# Patient Record
Sex: Female | Born: 1985
Health system: Southern US, Community
[De-identification: ages and names within clinical notes are randomized; demographics above are authoritative.]

## PROBLEM LIST (undated history)

## (undated) DIAGNOSIS — F419 Anxiety disorder, unspecified: Secondary | ICD-10-CM

## (undated) DIAGNOSIS — R55 Syncope and collapse: Secondary | ICD-10-CM

## (undated) DIAGNOSIS — T7840XA Allergy, unspecified, initial encounter: Secondary | ICD-10-CM

## (undated) HISTORY — DX: Anxiety disorder, unspecified: F41.9

## (undated) HISTORY — DX: Allergy, unspecified, initial encounter: T78.40XA

## (undated) HISTORY — DX: Syncope and collapse: R55

---

## 2009-06-04 ENCOUNTER — Emergency Department (HOSPITAL_COMMUNITY): Admission: EM | Admit: 2009-06-04 | Discharge: 2009-06-04 | Payer: Self-pay | Admitting: Family Medicine

## 2011-04-23 ENCOUNTER — Emergency Department (HOSPITAL_COMMUNITY): Payer: BC Managed Care – PPO

## 2011-04-23 ENCOUNTER — Emergency Department (HOSPITAL_COMMUNITY)
Admission: EM | Admit: 2011-04-23 | Discharge: 2011-04-23 | Disposition: A | Discharge status: Home / Self Care | Payer: BC Managed Care – PPO | Attending: Emergency Medicine | Admitting: Emergency Medicine

## 2011-04-23 ENCOUNTER — Other Ambulatory Visit: Payer: Self-pay

## 2011-04-23 ENCOUNTER — Encounter (HOSPITAL_COMMUNITY): Payer: Self-pay | Admitting: Emergency Medicine

## 2011-04-23 DIAGNOSIS — S40011A Contusion of right shoulder, initial encounter: Secondary | ICD-10-CM

## 2011-04-23 DIAGNOSIS — Y9269 Other specified industrial and construction area as the place of occurrence of the external cause: Secondary | ICD-10-CM | POA: Insufficient documentation

## 2011-04-23 DIAGNOSIS — R55 Syncope and collapse: Secondary | ICD-10-CM | POA: Insufficient documentation

## 2011-04-23 DIAGNOSIS — S40019A Contusion of unspecified shoulder, initial encounter: Secondary | ICD-10-CM | POA: Insufficient documentation

## 2011-04-23 DIAGNOSIS — J40 Bronchitis, not specified as acute or chronic: Secondary | ICD-10-CM

## 2011-04-23 DIAGNOSIS — R296 Repeated falls: Secondary | ICD-10-CM | POA: Insufficient documentation

## 2011-04-23 HISTORY — DX: Syncope and collapse: R55

## 2011-04-23 LAB — POCT I-STAT, CHEM 8
Chloride: 105 mEq/L (ref 96–112)
Creatinine, Ser: 0.8 mg/dL (ref 0.50–1.10)
Glucose, Bld: 86 mg/dL (ref 70–99)
Potassium: 4.1 mEq/L (ref 3.5–5.1)

## 2011-04-23 MED ORDER — AZITHROMYCIN 250 MG PO TABS: ORAL_TABLET | ORAL | Status: AC

## 2011-04-23 MED ORDER — IBUPROFEN 200 MG PO TABS
600.0000 mg | ORAL_TABLET | Freq: Four times a day (QID) | ORAL | Status: DC | PRN
  Administered 2011-04-23: 600 mg via ORAL
  Filled 2011-04-23: qty 3

## 2011-04-23 NOTE — ED Notes (Signed)
Urine collected and sent down to lab for keeping. No order for testing at this time. RN notified 

## 2011-04-23 NOTE — ED Provider Notes (Signed)
History     CSN: [id number]  Arrival date & time 04/23/11  1225   First MD Initiated Contact with Patient 04/23/11 1248      Chief Complaint  Patient presents with  . 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 She states she has clear rhinorrhea and a dry cough. She denies fever, nausea, or diarrhea. 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 She states she has pain in her right shoulder since she had her syncopal episode. She states she's had syncopal episodes before as a child. She states her mother also has syncopal episodes. Patient presents via EMS with full C-spine precautions.  PCP none  Past Medical History  Diagnosis Date  . Syncope     History reviewed. No pertinent past surgical history.  No family history on file. Mother of patient syncope  History  Substance Use Topics  . Smoking status: No  . Smokeless tobacco: Not on file  . Alcohol Use: 2.4 oz/week    2 Glasses of wine, 2 Cans of beer per week   employed Just graduated  from college OB History    Grav Para Term Preterm Abortions TAB SAB Ect Mult Living                  Review of Systems  All other systems reviewed and are negative.    Allergies  Amoxicillin  Home Medications   Current Outpatient Rx  Name Route Sig Dispense Refill  . GUAIFENESIN ER 600 MG PO TB12 Oral Take 600 mg by mouth 2 (two) times daily as needed. For congestion    . NORETHIN ACE-ETH ESTRAD-FE 1-20 MG-MCG PO TABS Oral Take 1 tablet by mouth daily.    Marland Kitchen PSEUDOEPH-DOXYLAMINE-DM-APAP 60-7.08-08-998 MG/30ML PO LIQD Oral  Take 30 mLs by mouth at bedtime as needed. For cough/cold      BP 111/71  Pulse 67  Temp(Src) 98.1 F (36.7 C) (Oral)  Resp 20  SpO2 100%  LMP 04/16/2011  Vital signs normal    Physical Exam  Nursing note and vitals reviewed. Constitutional: She is oriented to person, place, and time. She appears well-developed and well-nourished.  Non-toxic appearance. She does not appear ill. No distress.       Pt immobolized on back board with C collar in place.   Pt removed from backboard during my exam and C collar left in place.   HENT:  Head: Normocephalic and atraumatic.  Right Ear: External ear normal.  Left Ear: External ear normal.  Nose: Nose normal. No mucosal edema or rhinorrhea.  Mouth/Throat: Oropharynx is clear and moist and mucous membranes are normal. No dental abscesses or uvula swelling.  Eyes: Conjunctivae and EOM are normal. Pupils are equal, round, and reactive to light.  Neck: Normal range of motion and full passive range of motion without pain. Neck supple.       Patient denies any neck pain. Her C-spine was palpated after loosing her c-collar. There is able to have normal range of motion without any pain in her c-collar was removed  Cardiovascular: Normal  rate, regular rhythm and normal heart sounds.  Exam reveals no gallop and no friction rub.   No murmur heard. Pulmonary/Chest: Effort normal and breath sounds normal. No respiratory distress. She has no wheezes. She has no rhonchi. She has no rales. She exhibits no tenderness and no crepitus.  Abdominal: Soft. Normal appearance and bowel sounds are normal. She exhibits no distension. There is no tenderness. There is no rebound and no guarding.  Musculoskeletal: Normal range of motion. She exhibits no edema and no tenderness.       Moves all extremities well. Her thoracic and lumbar spine are nontender to palpation.  Patient has some tenderness over her a.c. joint on the right. Clavicle is nontender to palpation she is  able to abduct her right shoulder with minor discomfort.  Neurological: She is alert and oriented to person, place, and time. She has normal strength. No cranial nerve deficit.  Skin: Skin is warm, dry and intact. No rash noted. No erythema. No pallor.  Psychiatric: She has a normal mood and affect. Her speech is normal and behavior is normal. Her mood appears not anxious.    ED Course  Procedures (including critical care time)   Medications  ibuprofen (ADVIL,MOTRIN) tablet 600 mg (600 mg Oral Given 04/23/11 1340)  azithromycin (ZITHROMAX Z-PAK) 250 MG tablet (not administered)  Patient given ice pack.  She relates her pain is better. She  has intact range of motion. She states she does not need something stronger for pain.   Results for orders placed during the hospital encounter of 04/23/11  POCT I-STAT, CHEM 8      Component Value Range   Sodium 139  135 - 145 (mEq/L)   Potassium 4.1  3.5 - 5.1 (mEq/L)   Chloride 105  96 - 112 (mEq/L)   BUN 7  6 - 23 (mg/dL)   Creatinine, Ser 0.98  0.50 - 1.10 (mg/dL)   Glucose, Bld 86  70 - 99 (mg/dL)   Calcium, Ion 1.19  1.12 - 1.32 (mmol/L)   TCO2 27  0 - 100 (mmol/L)   Hemoglobin 13.6  12.0 - 15.0 (g/dL)   HCT 14.7  82.9 - 56.2 (%)    Laboratory interpretation all normal  Dg Chest 2 View  04/23/2011  *RADIOLOGY REPORT*  Clinical Data: Syncope and cough.  CHEST - 2 VIEW  Comparison: None.  Findings: The cardiomediastinal silhouette is within normal limits. The lungs are free of focal consolidations and pleural effusions. No edema. Visualized osseous structures have a normal appearance.  IMPRESSION: Negative exam.  Original Report Authenticated By: Patterson Hammersmith, M.D.   Dg Shoulder Right  04/23/2011  *RADIOLOGY REPORT*  Clinical Data: Syncope.  Fall.  RIGHT SHOULDER - 2+ VIEW  Comparison: Chest x-ray 04/23/2011.  Findings: There is no evidence for acute fracture or dislocation. No soft tissue foreign body or gas identified.  Visualized  right lung apex is unremarkable.  IMPRESSION: Negative exam.  Original Report Authenticated By: Patterson Hammersmith, M.D.    Date: 04/23/2011  Rate: 66  Rhythm: normal sinus rhythm  QRS Axis: normal  Intervals: normal  ST/T Wave abnormalities: normal  Conduction Disutrbances:none  Narrative Interpretation:   Old EKG Reviewed: none available     Dg Chest 2 View  04/23/2011  *RADIOLOGY REPORT*  Clinical Data: Syncope and cough.  CHEST - 2 VIEW  Comparison: None.  Findings: The cardiomediastinal silhouette is within normal limits. The lungs are free of focal consolidations and pleural effusions. No edema.  Visualized osseous structures have a normal appearance.  IMPRESSION: Negative exam.  Original Report Authenticated By: Patterson Hammersmith, M.D.   Dg Shoulder Right  04/23/2011  *RADIOLOGY REPORT*  Clinical Data: Syncope.  Fall.  RIGHT SHOULDER - 2+ VIEW  Comparison: Chest x-ray 04/23/2011.  Findings: There is no evidence for acute fracture or dislocation. No soft tissue foreign body or gas identified.  Visualized right lung apex is unremarkable.  IMPRESSION: Negative exam.  Original Report Authenticated By: Patterson Hammersmith, M.D.     1. Syncope   2. Contusion of shoulder, right   3. Bronchitis     New Prescriptions   AZITHROMYCIN (ZITHROMAX Z-PAK) 250 MG TABLET    Take 2 po the first day then once a day for the next 4 days.    Plan discharge  Devoria Albe, MD, FACEP    MDM          Ward Givens, MD 04/23/11 1534

## 2011-04-23 NOTE — ED Notes (Signed)
UJW:[id number]<YN> Expected date:04/23/11<BR> Expected time:12:21 PM<BR> Means of arrival:Ambulance<BR> Comments:<BR> M110. 26 yo f. Syncope and fall. LSB. 5 min

## 2012-03-30 ENCOUNTER — Ambulatory Visit: Payer: BC Managed Care – PPO | Admitting: Internal Medicine

## 2012-03-30 VITALS — BP 122/22 | HR 83 | Temp 99.0°F | Resp 12 | Ht 67.0 in | Wt 142.0 lb

## 2012-03-30 DIAGNOSIS — B9789 Other viral agents as the cause of diseases classified elsewhere: Secondary | ICD-10-CM

## 2012-03-30 DIAGNOSIS — J069 Acute upper respiratory infection, unspecified: Secondary | ICD-10-CM

## 2012-03-31 NOTE — Progress Notes (Signed)
  Subjective:    Patient ID: Pamela Barnes, female    DOB: 1985-05-25, 27 y.o.   MRN: [id number]  HPI was treated for flu symptoms at another urgent care 3 days ago, and taken out of work. 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 She is now well, and her fever has resolved.  [employer] graduate in Pharmacist, community  Review of Systems     Objective:   Physical Exam Vital signs stable HEENT clear Lungs clear       Assessment & Plan:  Resolved infection Return to work

## 2012-08-28 ENCOUNTER — Encounter (HOSPITAL_COMMUNITY): Payer: Self-pay | Admitting: Emergency Medicine

## 2012-08-28 ENCOUNTER — Emergency Department (HOSPITAL_COMMUNITY)
Admission: EM | Admit: 2012-08-28 | Discharge: 2012-08-28 | Discharge status: Against Medical Advice | Payer: BC Managed Care – PPO | Source: Home / Self Care | Attending: Emergency Medicine | Admitting: Emergency Medicine

## 2012-08-28 DIAGNOSIS — W19XXXA Unspecified fall, initial encounter: Secondary | ICD-10-CM

## 2012-08-28 DIAGNOSIS — R55 Syncope and collapse: Secondary | ICD-10-CM

## 2012-08-28 DIAGNOSIS — S0990XA Unspecified injury of head, initial encounter: Secondary | ICD-10-CM

## 2012-08-28 NOTE — ED Notes (Signed)
Patient reports she was at a concert last night felt dizzy and passed out. This is a recurring condition. Boyfriend attempted to catch her but she did hit her head on the concrete floor. No other problems. Slight headache this morning from where she hit her head. Patient is alert and oriented.

## 2012-08-28 NOTE — ED Provider Notes (Signed)
Medical screening examination/treatment/procedure(s) were performed by non-physician practitioner and as supervising physician I was immediately available for consultation/collaboration.  Raynald Blend, MD 08/28/12 1226

## 2012-08-28 NOTE — ED Provider Notes (Signed)
History     CSN: [id number]  Arrival date & time 08/28/12  1031   First MD Initiated Contact with Patient 08/28/12 1140      Chief Complaint  Patient presents with  . Loss of Consciousness    (Consider location/radiation/quality/duration/timing/severity/associated sxs/prior treatment) HPI Comments: This 27 year old female is accompanied by her significant other for evaluation after she had a syncopal episode at a concert last night in which she fell to the ground and struck her head. They stated that there was a physician that evaluated her as well as EMS on site and she was feeling well and asymptomatic at the time and did not seek medical treatment. She has a history of unexplained syncope since childhood. Several years ago she was evaluated by a neurologist but does not remember if she was evaluated by cardiologist. Nonetheless, she continues to have these syncopal episodes. She is unsure as to what causes them. Her only complaint today is some mild soreness to the scalp she had her head. She denies problems with vision, speech, hearing, swallowing, focal paresthesias or weakness. She denies incontinence or imbalance. Denies headache or dizziness. Denies chest pain, palpitations, irregular heartbeat, shortness of breath, abdominal pain, nausea or vomiting.   Past Medical History  Diagnosis Date  . Syncope     History reviewed. No pertinent past surgical history.  No family history on file.  History  Substance Use Topics  . Smoking status: Never Smoker   . Smokeless tobacco: Not on file  . Alcohol Use: 2.4 oz/week    2 Glasses of wine, 2 Cans of beer per week    OB History   Grav Para Term Preterm Abortions TAB SAB Ect Mult Living                  Review of Systems  Constitutional: Negative.   HENT: Negative for ear pain, congestion, sore throat, facial swelling, rhinorrhea, trouble swallowing, neck pain, neck stiffness, postnasal drip and ear discharge.   Eyes: Negative  for photophobia, pain, discharge, redness, itching and visual disturbance.  Respiratory: Negative for cough, choking, chest tightness, shortness of breath, wheezing and stridor.   Cardiovascular: Negative for chest pain and leg swelling.  Gastrointestinal: Negative.   Genitourinary: Negative.   Musculoskeletal: Negative for myalgias, back pain, joint swelling and gait problem.  Skin: Negative.   Neurological: Negative for dizziness, tremors, seizures, syncope, facial asymmetry, speech difficulty, weakness, light-headedness, numbness and headaches.  Hematological: Does not bruise/bleed easily.  Psychiatric/Behavioral: Negative.     Allergies  Dust mite extract and Amoxicillin  Home Medications   Current Outpatient Rx  Name  Route  Sig  Dispense  Refill  . naproxen (NAPROSYN) 250 MG tablet   Oral   Take 250 mg by mouth 2 (two) times daily with a meal.         . norethindrone-ethinyl estradiol (JUNEL FE,GILDESS FE,LOESTRIN FE) 1-20 MG-MCG tablet   Oral   Take 1 tablet by mouth daily.         Marland Kitchen guaiFENesin (MUCINEX) 600 MG 12 hr tablet   Oral   Take 600 mg by mouth 2 (two) times daily as needed. For congestion         . Pseudoeph-Doxylamine-DM-APAP (NYQUIL) 60-7.08-08-998 MG/30ML LIQD   Oral   Take 30 mLs by mouth at bedtime as needed. For cough/cold           BP 97/73  Pulse 70  Temp(Src) 98.3 F (36.8 C)  Resp 14  SpO2  100%  LMP 08/22/2012  Physical Exam  Nursing note and vitals reviewed. Constitutional: She is oriented to person, place, and time. She appears well-developed and well-nourished. No distress.  HENT:  Head: Normocephalic and atraumatic.  Right Ear: External ear normal.  Left Ear: External ear normal.  Nose: Nose normal.  Mouth/Throat: Oropharynx is clear and moist. No oropharyngeal exudate.  Left TM obscured with cerumen.  Eyes: Conjunctivae and EOM are normal. Pupils are equal, round, and reactive to light. Right eye exhibits no discharge.  Left eye exhibits no discharge. No scleral icterus.  Neck: Normal range of motion. Neck supple.  No spinal tenderness. Or deformity.  Cardiovascular: Normal rate, normal heart sounds and intact distal pulses.  Exam reveals no gallop and no friction rub.   No murmur heard. Pulmonary/Chest: Effort normal and breath sounds normal. No respiratory distress. She has no wheezes. She has no rales.  Abdominal: Soft. There is no tenderness. There is no rebound and no guarding.  Musculoskeletal: Normal range of motion. She exhibits no edema and no tenderness.  Lymphadenopathy:    She has no cervical adenopathy.  Neurological: She is alert and oriented to person, place, and time. She has normal strength. She displays no tremor. No sensory deficit. She exhibits normal muscle tone. She displays a negative Romberg sign. Coordination and gait normal. GCS eye subscore is 4. GCS verbal subscore is 5. GCS motor subscore is 6.  Reflex Scores:      Patellar reflexes are 1+ on the right side and 1+ on the left side. Skin: Skin is warm and dry.  Psychiatric: She has a normal mood and affect. Her behavior is normal.    ED Course  Procedures (including critical care time)  Labs Reviewed - No data to display No results found.   1. Syncope and collapse   2. Fall, initial encounter   3. Head injury, acute, initial encounter       MDM  After having her syncope episode last night, falling and striking her head she states she does feel well but she presented to the urgent care today to make sure that everything was okay and nothing new or serious was going on. After performing an H&P which was essentially negative he was advised that she be shuttled down to the emergency department for additional evaluation as we are limited in the evaluation of syncope and head injury urgent care. After considerable conference she and her significant other decided that she felt well today and did not wish to go down to the  emergency department for additional evaluation. 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 She will be given instructions on syncope and red flags. Patient to sign AMA in regards to her declining to go to the emergency department for additional evaluation. If there is a neurologic or Cardiologic problem causing these syncopal episodes it may occur at any time such as when driving, working or performing dangerous tasks. The condition may get worse. Consequences are potential injury, hand or heart injury, danger to others or other potential a severe injury.        Hayden Rasmussen, NP 08/28/12 1224

## 2014-03-17 ENCOUNTER — Ambulatory Visit (INDEPENDENT_AMBULATORY_CARE_PROVIDER_SITE_OTHER): Payer: Managed Care, Other (non HMO) | Admitting: Cardiology

## 2014-03-17 ENCOUNTER — Encounter: Payer: Self-pay | Admitting: Cardiology

## 2014-03-17 VITALS — BP 122/72 | HR 79 | Ht 67.0 in | Wt 153.1 lb

## 2014-03-17 DIAGNOSIS — R55 Syncope and collapse: Secondary | ICD-10-CM

## 2014-03-17 DIAGNOSIS — I951 Orthostatic hypotension: Secondary | ICD-10-CM

## 2014-03-17 NOTE — Patient Instructions (Signed)
Your physician recommends that you continue on your current medications as directed. Please refer to the Current Medication list given to you today.   Your physician has requested that you have an echocardiogram. Echocardiography is a painless test that uses sound waves to create images of your heart. It provides your doctor with information about the size and shape of your heart and how well your heart's chambers and valves are working. This procedure takes approximately one hour. There are no restrictions for this procedure.    Your physician recommends that you schedule a follow-up appointment in: AS NEEDED WITH DR NELSON  

## 2014-03-17 NOTE — Progress Notes (Signed)
Patient ID: Pamela Barnes, female   DOB: 09-06-1985, 29 y.o.   MRN: [id number]    Patient Name: Pamela Barnes Date of Encounter: 03/17/2014  Primary Care Provider:  Pcp Not In System Primary Cardiologist:  Lars Masson   Problem List   Past Medical History  Diagnosis Date  . Syncope    History reviewed. No pertinent past surgical history.  Allergies  Allergies  Allergen Reactions  . Dust Mite Extract   . Amoxicillin Rash and Hives    HPI  Pleasant 29 year old female with no significant past medical history other than recurrent syncope that started during her childhood. She will usually pass out but once he year and those episodes in childhood were associated with prolonged standing but sometimes she could be sitting or just playing. She has had 3 episodes in the last year on one of them she was on sitting for prolonged time at the office at a computer she stood up went to bathroom and felt very weak nauseous set of bathroom floor and woke up later. The other occasion was last summer on her whole [place] she was at the concert with her fianc and passed out after 20 minutes of standing. She has never passed out while driving. She doesn't feel any palpitations prior to these episodes or any episodes of chest pain she has had an episode in the past where she was straining on the toilet. She denies any chest pain. She feels some palpitation after the episodes associated with shortness of breath. No involuntary bowel or urine incontinence associated with the episode no seizure-like activity witnessed during the episodes.  Home Medications  Prior to Admission medications   Medication Sig Start Date End Date Taking? Authorizing Provider  desogestrel-ethinyl estradiol (APRI) 0.15-30 MG-MCG tablet Take 1 tablet by mouth daily.  03/02/14  Yes Historical Provider, MD  guaiFENesin (MUCINEX) 600 MG 12 hr tablet Take 600 mg by mouth 2 (two) times daily as needed. For congestion   Yes Historical  Provider, MD  ibuprofen (ADVIL,MOTRIN) 800 MG tablet Take 800 mg by mouth daily as needed.   Yes Historical Provider, MD  naproxen (NAPROSYN) 250 MG tablet Take 250 mg by mouth 2 (two) times daily with a meal.   Yes Historical Provider, MD    Family History  History reviewed. No pertinent family history.  Social History  History   Social History  . Marital Status: Single    Spouse Name: N/A    Number of Children: N/A  . Years of Education: N/A   Occupational History  . Not on file.   Social History Main Topics  . Smoking status: Never Smoker   . Smokeless tobacco: Not on file  . Alcohol Use: 2.4 oz/week    2 Glasses of wine, 2 Cans of beer per week  . Drug Use: No  . Sexual Activity: Yes    Birth Control/ Protection: Condom   Other Topics Concern  . Not on file   Social History Narrative     Review of Systems, as per HPI, otherwise negative General:  No chills, fever, night sweats or weight changes.  Cardiovascular:  No chest pain, dyspnea on exertion, edema, orthopnea, palpitations, paroxysmal nocturnal dyspnea. Dermatological: No rash, lesions/masses Respiratory: No cough, dyspnea Urologic: No hematuria, dysuria Abdominal:   No nausea, vomiting, diarrhea, bright red blood per rectum, melena, or hematemesis Neurologic:  No visual changes, wkns, changes in mental status. All other systems reviewed and are otherwise negative except as noted above.  Physical Exam  Blood pressure 122/72, pulse 79, height 5\' 7"  (1.702 m), weight 153 lb 1.9 oz (69.455 kg).  General: Pleasant, NAD Psych: Normal affect. Neuro: Alert and oriented X 3. Moves all extremities spontaneously. HEENT: Normal  Neck: Supple without bruits or JVD. Lungs:  Resp regular and unlabored, CTA. Heart: RRR no s3, s4, or murmurs. Abdomen: Soft, non-tender, non-distended, BS + x 4.  Extremities: No clubbing, cyanosis or edema. DP/PT/Radials 2+ and equal bilaterally.  Labs:  No results for input(s):  CKTOTAL, CKMB, TROPONINI in the last 72 hours. Lab Results  Component Value Date   HGB 13.6 04/23/2011   HCT 40.0 04/23/2011    No results found for: DDIMER Invalid input(s): POCBNP    Component Value Date/Time   NA 139 04/23/2011 1504   K 4.1 04/23/2011 1504   CL 105 04/23/2011 1504   GLUCOSE 86 04/23/2011 1504   BUN 7 04/23/2011 1504   CREATININE 0.80 04/23/2011 1504   No results found for: CHOL, HDL, LDLCALC, TRIG  Accessory Clinical Findings  Echocardiogram - none  ECG - NSR, normal ECG, unchanged from 2013.    Assessment & Plan  29 year old female   1. Recurrent syncope - orthostatic and vasovagal - negative orthostatics in the office today. She is advise to stay hydrated all the time, add electrolytes drinks like Gatorade on hot days and days she goes to the gym. No medication needed at this time. We will order echocardiogram to rule out structural heart disease.  Follow up as needed.   Lars MassonNELSON, Jadalyn Oliveri H, MD, [facility] 03/17/2014, 12:08 PM

## 2014-03-24 ENCOUNTER — Ambulatory Visit (HOSPITAL_COMMUNITY): Payer: Managed Care, Other (non HMO) | Attending: Cardiovascular Disease

## 2014-03-24 DIAGNOSIS — R55 Syncope and collapse: Secondary | ICD-10-CM | POA: Diagnosis not present

## 2014-03-24 NOTE — Progress Notes (Signed)
2D Echo completed. 03/24/2014 

## 2014-06-17 ENCOUNTER — Encounter: Payer: Self-pay | Admitting: Obstetrics and Gynecology

## 2015-11-28 ENCOUNTER — Telehealth: Payer: Self-pay | Admitting: Internal Medicine

## 2015-11-28 NOTE — Telephone Encounter (Signed)
Fine with me

## 2015-11-28 NOTE — Telephone Encounter (Signed)
Pt request to be new pt with Dr. Okey Duprerawford. Pt stating her spouse, Melvenia BeamJustin McGehee, ask about it when he was here and Dr. Halford Decamprawford okey this. Please verify.

## 2015-12-09 ENCOUNTER — Ambulatory Visit (INDEPENDENT_AMBULATORY_CARE_PROVIDER_SITE_OTHER): Payer: Managed Care, Other (non HMO) | Admitting: Internal Medicine

## 2015-12-09 ENCOUNTER — Encounter: Payer: Self-pay | Admitting: Internal Medicine

## 2015-12-09 DIAGNOSIS — F4323 Adjustment disorder with mixed anxiety and depressed mood: Secondary | ICD-10-CM | POA: Diagnosis not present

## 2015-12-09 MED ORDER — BUSPIRONE HCL 5 MG PO TABS: 5.0000 mg | ORAL_TABLET | Freq: Two times a day (BID) | ORAL | 0 refills | Status: DC | PRN

## 2015-12-09 NOTE — Progress Notes (Signed)
   Subjective:    Patient ID: Pamela Barnes, female    DOB: 1985/04/11, 30 y.o.   MRN: [id number]  HPI The patient is a 30 YO female coming in new for depression and anxiety. She has been struggling with it for some time. She wants to have help for it with a counselor. She saw a Librarian, academicchristian counselor in the past and did not like that experience. She denies long term problems with this but a lot of stress in her life in the last year with recent new house, marriage, MBA program and working full time. Denies SI/HI. Is sleeping well but tired a lot. 1 panic attack which was mild to moderate and <5 minutes. She has never taken anything for this in the past.   [facility], social history reviewed and updated.   Review of Systems  Constitutional: Positive for activity change and fatigue. Negative for appetite change, chills, fever and unexpected weight change.  HENT: Negative.   Eyes: Negative.   Respiratory: Negative for cough, chest tightness and shortness of breath.   Cardiovascular: Negative for chest pain, palpitations and leg swelling.  Gastrointestinal: Negative for abdominal distention, abdominal pain, constipation, diarrhea and nausea.  Musculoskeletal: Negative.   Skin: Negative.   Neurological: Negative.   Psychiatric/Behavioral: Positive for decreased concentration and dysphoric mood. Negative for agitation, behavioral problems, confusion, hallucinations, self-injury, sleep disturbance and suicidal ideas. The patient is nervous/anxious. The patient is not hyperactive.       Objective:   Physical Exam  Constitutional: She is oriented to person, place, and time. She appears well-developed and well-nourished.  HENT:  Head: Normocephalic and atraumatic.  Eyes: EOM are normal.  Neck: Normal range of motion.  Cardiovascular: Normal rate and regular rhythm.   Pulmonary/Chest: Effort normal and breath sounds normal. No respiratory distress. She has no wheezes. She has no rales.  Abdominal:  Soft. Bowel sounds are normal. She exhibits no distension. There is no tenderness. There is no rebound.  Musculoskeletal: She exhibits no edema.  Neurological: She is alert and oriented to person, place, and time. Coordination normal.  Skin: Skin is warm and dry.  Psychiatric: She has a normal mood and affect.   Vitals:   12/09/15 1446  BP: 116/70  Pulse: 65  Resp: 12  Temp: 98.4 F (36.9 C)  TempSrc: Oral  SpO2: 98%  Weight: 157 lb 6.4 oz (71.4 kg)  Height: 5' 7.5" (1.715 m)      Assessment & Plan:

## 2015-12-09 NOTE — Patient Instructions (Signed)
We have sent in buspar for the mood which can help to level some. It is okay to try to take it up to 2 times per day although just take it as needed.   For the counselor we recommend: [facility] which has several good counselors and their website has their specialties and bios so you can find someone you are comfortable with.  Stress and Stress Management Stress is a normal reaction to life events. It is what you feel when life demands more than you are used to or more than you can handle. Some stress can be useful. For example, the stress reaction can help you catch the last bus of the day, study for a test, or meet a deadline at work. But stress that occurs too often or for too long can cause problems. It can affect your emotional health and interfere with relationships and normal daily activities. Too much stress can weaken your immune system and increase your risk for physical illness. If you already have a medical problem, stress can make it worse. CAUSES  All sorts of life events may cause stress. An event that causes stress for one person may not be stressful for another person. Major life events commonly cause stress. These may be positive or negative. Examples include losing your job, moving into a new home, getting married, having a baby, or losing a loved one. Less obvious life events may also cause stress, especially if they occur day after day or in combination. Examples include working long hours, driving in traffic, caring for children, being in debt, or being in a difficult relationship. SIGNS AND SYMPTOMS Stress may cause emotional symptoms including, the following:  Anxiety. This is feeling worried, afraid, on edge, overwhelmed, or out of control.  Anger. This is feeling irritated or impatient.  Depression. This is feeling sad, down, helpless, or guilty.  Difficulty focusing, remembering, or making decisions. Stress may cause physical symptoms, including the  following:   Aches and pains. These may affect your head, neck, back, stomach, or other areas of your body.  Tight muscles or clenched jaw.  Low energy or trouble sleeping. Stress may cause unhealthy behaviors, including the following:   Eating to feel better (overeating) or skipping meals.  Sleeping too little, too much, or both.  Working too much or putting off tasks (procrastination).  Smoking, drinking alcohol, or using drugs to feel better. DIAGNOSIS  Stress is diagnosed through an assessment by your health care provider. Your health care provider will ask questions about your symptoms and any stressful life events.Your health care provider will also ask about your medical history and may order blood tests or other tests. Certain medical conditions and medicine can cause physical symptoms similar to stress. Mental illness can cause emotional symptoms and unhealthy behaviors similar to stress. Your health care provider may refer you to a mental health professional for further evaluation.  TREATMENT  Stress management is the recommended treatment for stress.The goals of stress management are reducing stressful life events and coping with stress in healthy ways.  Techniques for reducing stressful life events include the following:  Stress identification. Self-monitor for stress and identify what causes stress for you. These skills may help you to avoid some stressful events.  Time management. Set your priorities, keep a calendar of events, and learn to say "no." These tools can help you avoid making too many commitments. Techniques for coping with stress include the following:  Rethinking the problem. Try to think  realistically about stressful events rather than ignoring them or overreacting. Try to find the positives in a stressful situation rather than focusing on the negatives.  Exercise. Physical exercise can release both physical and emotional tension. The key is to find a  form of exercise you enjoy and do it regularly.  Relaxation techniques. These relax the body and mind. Examples include yoga, meditation, tai chi, biofeedback, deep breathing, progressive muscle relaxation, listening to music, being out in nature, journaling, and other hobbies. Again, the key is to find one or more that you enjoy and can do regularly.  Healthy lifestyle. Eat a balanced diet, get plenty of sleep, and do not smoke. Avoid using alcohol or drugs to relax.  Strong support network. Spend time with family, friends, or other people you enjoy being around.Express your feelings and talk things over with someone you trust. Counseling or talktherapy with a mental health professional may be helpful if you are having difficulty managing stress on your own. Medicine is typically not recommended for the treatment of stress.Talk to your health care provider if you think you need medicine for symptoms of stress. HOME CARE INSTRUCTIONS  Keep all follow-up visits as directed by your health care provider.  Take all medicines as directed by your health care provider. SEEK MEDICAL CARE IF:  Your symptoms get worse or you start having new symptoms.  You feel overwhelmed by your problems and can no longer manage them on your own. SEEK IMMEDIATE MEDICAL CARE IF:  You feel like hurting yourself or someone else.   This information is not intended to replace advice given to you by your health care provider. Make sure you discuss any questions you have with your health care provider.   Document Released: 08/22/2000 Document Revised: 03/19/2014 Document Reviewed: 10/21/2012 Elsevier Interactive Patient Education [employer].

## 2015-12-09 NOTE — Progress Notes (Signed)
Pre visit review using our clinic review tool, if applicable. No additional management support is needed unless otherwise documented below in the visit note. 

## 2015-12-09 NOTE — Assessment & Plan Note (Signed)
Rx for buspar and given recommendations for counseling. She is exercising and given stress relief strategies. She will work on prioritizing her activities to preserve her energy.

## 2016-06-27 ENCOUNTER — Telehealth: Payer: Self-pay | Admitting: Internal Medicine

## 2016-06-27 NOTE — Telephone Encounter (Signed)
Patient requesting refill on birth control to be sent to [facility] on [address].

## 2016-06-27 NOTE — Telephone Encounter (Signed)
PCP hasnt prescribed this medication for patient, please advise

## 2016-06-28 MED ORDER — DESOGESTREL-ETHINYL ESTRADIOL 0.15-30 MG-MCG PO TABS: 1.0000 | ORAL_TABLET | Freq: Every day | ORAL | 0 refills | Status: DC

## 2016-07-06 ENCOUNTER — Ambulatory Visit (INDEPENDENT_AMBULATORY_CARE_PROVIDER_SITE_OTHER): Payer: Managed Care, Other (non HMO) | Admitting: Internal Medicine

## 2016-07-06 ENCOUNTER — Encounter: Payer: Self-pay | Admitting: Internal Medicine

## 2016-07-06 DIAGNOSIS — Z0000 Encounter for general adult medical examination without abnormal findings: Secondary | ICD-10-CM

## 2016-07-06 DIAGNOSIS — F4323 Adjustment disorder with mixed anxiety and depressed mood: Secondary | ICD-10-CM

## 2016-07-06 NOTE — Patient Instructions (Signed)
It is worth getting in with a gynecologist. Two good groups in town are [employer] and [facility] so you can get on the website and get in with any of them.   Health Maintenance, Female Adopting a healthy lifestyle and getting preventive care can go a long way to promote health and wellness. Talk with your health care provider about what schedule of regular examinations is right for you. This is a good chance for you to check in with your provider about disease prevention and staying healthy. In between checkups, there are plenty of things you can do on your own. Experts have done a lot of research about which lifestyle changes and preventive measures are most likely to keep you healthy. Ask your health care provider for more information. Weight and diet Eat a healthy diet  Be sure to include plenty of vegetables, fruits, low-fat dairy products, and lean protein.  Do not eat a lot of foods high in solid fats, added sugars, or salt.  Get regular exercise. This is one of the most important things you can do for your health.  Most adults should exercise for at least 150 minutes each week. The exercise should increase your heart rate and make you sweat (moderate-intensity exercise).  Most adults should also do strengthening exercises at least twice a week. This is in addition to the moderate-intensity exercise. Maintain a healthy weight  Body mass index (BMI) is a measurement that can be used to identify possible weight problems. It estimates body fat based on height and weight. Your health care provider can help determine your BMI and help you achieve or maintain a healthy weight.  For females 79 years of age and older:  A BMI below 18.5 is considered underweight.  A BMI of 18.5 to 24.9 is normal.  A BMI of 25 to 29.9 is considered overweight.  A BMI of 30 and above is considered obese. Watch levels of cholesterol and blood lipids  You should start having your blood tested  for lipids and cholesterol at 29 years of age, then have this test every 5 years.  You may need to have your cholesterol levels checked more often if:  Your lipid or cholesterol levels are high.  You are older than 29 years of age.  You are at high risk for heart disease. Cancer screening Lung Cancer  Lung cancer screening is recommended for adults 34-47 years old who are at high risk for lung cancer because of a history of smoking.  A yearly low-dose CT scan of the lungs is recommended for people who:  Currently smoke.  Have quit within the past 15 years.  Have at least a 30-pack-year history of smoking. A pack year is smoking an average of one pack of cigarettes a day for 1 year.  Yearly screening should continue until it has been 15 years since you quit.  Yearly screening should stop if you develop a health problem that would prevent you from having lung cancer treatment. Breast Cancer  Practice breast self-awareness. This means understanding how your breasts normally appear and feel.  It also means doing regular breast self-exams. Let your health care provider know about any changes, no matter how small.  If you are in your 20s or 30s, you should have a clinical breast exam (CBE) by a health care provider every 1-3 years as part of a regular health exam.  If you are 18 or older, have a CBE every year. Also consider having  a breast X-ray (mammogram) every year.  If you have a family history of breast cancer, talk to your health care provider about genetic screening.  If you are at high risk for breast cancer, talk to your health care provider about having an MRI and a mammogram every year.  Breast cancer gene (BRCA) assessment is recommended for women who have family members with BRCA-related cancers. BRCA-related cancers include:  Breast.  Ovarian.  Tubal.  Peritoneal cancers.  Results of the assessment will determine the need for genetic counseling and BRCA1 and  BRCA2 testing. Cervical Cancer  Your health care provider may recommend that you be screened regularly for cancer of the pelvic organs (ovaries, uterus, and vagina). This screening involves a pelvic examination, including checking for microscopic changes to the surface of your cervix (Pap test). You may be encouraged to have this screening done every 3 years, beginning at age 59.  For women ages 50-65, health care providers may recommend pelvic exams and Pap testing every 3 years, or they may recommend the Pap and pelvic exam, combined with testing for human papilloma virus (HPV), every 5 years. Some types of HPV increase your risk of cervical cancer. Testing for HPV may also be done on women of any age with unclear Pap test results.  Other health care providers may not recommend any screening for nonpregnant women who are considered low risk for pelvic cancer and who do not have symptoms. Ask your health care provider if a screening pelvic exam is right for you.  If you have had past treatment for cervical cancer or a condition that could lead to cancer, you need Pap tests and screening for cancer for at least 20 years after your treatment. If Pap tests have been discontinued, your risk factors (such as having a new sexual partner) need to be reassessed to determine if screening should resume. Some women have medical problems that increase the chance of getting cervical cancer. In these cases, your health care provider may recommend more frequent screening and Pap tests. Colorectal Cancer  This type of cancer can be detected and often prevented.  Routine colorectal cancer screening usually begins at 29 years of age and continues through 29 years of age.  Your health care provider may recommend screening at an earlier age if you have risk factors for colon cancer.  Your health care provider may also recommend using home test kits to check for hidden blood in the stool.  A small camera at the end  of a tube can be used to examine your colon directly (sigmoidoscopy or colonoscopy). This is done to check for the earliest forms of colorectal cancer.  Routine screening usually begins at age 81.  Direct examination of the colon should be repeated every 5-10 years through 29 years of age. However, you may need to be screened more often if early forms of precancerous polyps or small growths are found. Skin Cancer  Check your skin from head to toe regularly.  Tell your health care provider about any new moles or changes in moles, especially if there is a change in a mole's shape or color.  Also tell your health care provider if you have a mole that is larger than the size of a pencil eraser.  Always use sunscreen. Apply sunscreen liberally and repeatedly throughout the day.  Protect yourself by wearing long sleeves, pants, a wide-brimmed hat, and sunglasses whenever you are outside. Heart disease, diabetes, and high blood pressure  High blood pressure  causes heart disease and increases the risk of stroke. High blood pressure is more likely to develop in:  People who have blood pressure in the high end of the normal range (130-139/85-89 mm Hg).  People who are overweight or obese.  People who are African American.  If you are 9-15 years of age, have your blood pressure checked every 3-5 years. If you are 69 years of age or older, have your blood pressure checked every year. You should have your blood pressure measured twice-once when you are at a hospital or clinic, and once when you are not at a hospital or clinic. Record the average of the two measurements. To check your blood pressure when you are not at a hospital or clinic, you can use:  An automated blood pressure machine at a pharmacy.  A home blood pressure monitor.  If you are between 40 years and 13 years old, ask your health care provider if you should take aspirin to prevent strokes.  Have regular diabetes screenings.  This involves taking a blood sample to check your fasting blood sugar level.  If you are at a normal weight and have a low risk for diabetes, have this test once every three years after 29 years of age.  If you are overweight and have a high risk for diabetes, consider being tested at a younger age or more often. Preventing infection Hepatitis B  If you have a higher risk for hepatitis B, you should be screened for this virus. You are considered at high risk for hepatitis B if:  You were born in a country where hepatitis B is common. Ask your health care provider which countries are considered high risk.  Your parents were born in a high-risk country, and you have not been immunized against hepatitis B (hepatitis B vaccine).  You have HIV or AIDS.  You use needles to inject street drugs.  You live with someone who has hepatitis B.  You have had sex with someone who has hepatitis B.  You get hemodialysis treatment.  You take certain medicines for conditions, including cancer, organ transplantation, and autoimmune conditions. Hepatitis C  Blood testing is recommended for:  Everyone born from [birth year] through 1963.  Anyone with known risk factors for hepatitis C. Sexually transmitted infections (STIs)  You should be screened for sexually transmitted infections (STIs) including gonorrhea and chlamydia if:  You are sexually active and are younger than 29 years of age.  You are older than 29 years of age and your health care provider tells you that you are at risk for this type of infection.  Your sexual activity has changed since you were last screened and you are at an increased risk for chlamydia or gonorrhea. Ask your health care provider if you are at risk.  If you do not have HIV, but are at risk, it may be recommended that you take a prescription medicine daily to prevent HIV infection. This is called pre-exposure prophylaxis (PrEP). You are considered at risk if:  You are  sexually active and do not regularly use condoms or know the HIV status of your partner(s).  You take drugs by injection.  You are sexually active with a partner who has HIV. Talk with your health care provider about whether you are at high risk of being infected with HIV. If you choose to begin PrEP, you should first be tested for HIV. You should then be tested every 3 months for as long as you are  taking PrEP. Pregnancy  If you are premenopausal and you may become pregnant, ask your health care provider about preconception counseling.  If you may become pregnant, take 400 to 800 micrograms (mcg) of folic acid every day.  If you want to prevent pregnancy, talk to your health care provider about birth control (contraception). Osteoporosis and menopause  Osteoporosis is a disease in which the bones lose minerals and strength with aging. This can result in serious bone fractures. Your risk for osteoporosis can be identified using a bone density scan.  If you are 38 years of age or older, or if you are at risk for osteoporosis and fractures, ask your health care provider if you should be screened.  Ask your health care provider whether you should take a calcium or vitamin D supplement to lower your risk for osteoporosis.  Menopause may have certain physical symptoms and risks.  Hormone replacement therapy may reduce some of these symptoms and risks. Talk to your health care provider about whether hormone replacement therapy is right for you. Follow these instructions at home:  Schedule regular health, dental, and eye exams.  Stay current with your immunizations.  Do not use any tobacco products including cigarettes, chewing tobacco, or electronic cigarettes.  If you are pregnant, do not drink alcohol.  If you are breastfeeding, limit how much and how often you drink alcohol.  Limit alcohol intake to no more than 1 drink per day for nonpregnant women. One drink equals 12 ounces of  beer, 5 ounces of wine, or 1 ounces of hard liquor.  Do not use street drugs.  Do not share needles.  Ask your health care provider for help if you need support or information about quitting drugs.  Tell your health care provider if you often feel depressed.  Tell your health care provider if you have ever been abused or do not feel safe at home. This information is not intended to replace advice given to you by your health care provider. Make sure you discuss any questions you have with your health care provider. Document Released: 09/11/2010 Document Revised: 08/04/2015 Document Reviewed: 11/30/2014 Elsevier Interactive Patient Education  2017 [employer].

## 2016-07-06 NOTE — Progress Notes (Signed)
   Subjective:    Patient ID: Pamela Barnes, female    DOB: August 13, 1985, 31 y.o.   MRN: [id number]  HPI The patient is a 31 YO female coming in for wellness. No concerns.   PMH, [facility], social history reviewed and updated.   Review of Systems  Constitutional: Negative.   HENT: Negative.   Eyes: Negative.   Respiratory: Negative for cough, chest tightness and shortness of breath.   Cardiovascular: Negative for chest pain, palpitations and leg swelling.  Gastrointestinal: Negative for abdominal distention, abdominal pain, constipation, diarrhea, nausea and vomiting.  Musculoskeletal: Negative.   Skin: Negative.   Neurological: Negative.   Psychiatric/Behavioral: Negative.       Objective:   Physical Exam  Constitutional: She is oriented to person, place, and time. She appears well-developed and well-nourished.  HENT:  Head: Normocephalic and atraumatic.  Eyes: EOM are normal.  Neck: Normal range of motion.  Cardiovascular: Normal rate and regular rhythm.   Pulmonary/Chest: Effort normal and breath sounds normal. No respiratory distress. She has no wheezes. She has no rales.  Abdominal: Soft. Bowel sounds are normal. She exhibits no distension. There is no tenderness. There is no rebound.  Musculoskeletal: She exhibits no edema.  Neurological: She is alert and oriented to person, place, and time. Coordination normal.  Skin: Skin is warm and dry.  Psychiatric: She has a normal mood and affect.   Vitals:   07/06/16 0901  BP: 130/70  Pulse: 71  Resp: 12  Temp: 98.9 F (37.2 C)  TempSrc: Oral  SpO2: 100%  Weight: 159 lb (72.1 kg)  Height: 5' 7.5" (1.715 m)      Assessment & Plan:

## 2016-07-06 NOTE — Progress Notes (Signed)
Pre visit review using our clinic review tool, if applicable. No additional management support is needed unless otherwise documented below in the visit note. 

## 2016-07-06 NOTE — Assessment & Plan Note (Signed)
Doing well with rare buspar and counseling. No worsening of symptoms.

## 2016-07-06 NOTE — Assessment & Plan Note (Signed)
Recent labs from 2016 normal so will not repeat. Tetanus and flu up to date. Exercising and non-smoker. Prefers to establish with gyn for pap smear. Counseled on sun safety and mole surveillance as well as dangers of distracted driving. Given screening recommendations.

## 2016-08-17 ENCOUNTER — Encounter: Payer: Self-pay | Admitting: Internal Medicine

## 2016-08-20 ENCOUNTER — Other Ambulatory Visit: Payer: Self-pay | Admitting: Internal Medicine

## 2016-08-20 MED ORDER — DESOGESTREL-ETHINYL ESTRADIOL 0.15-30 MG-MCG PO TABS: 1.0000 | ORAL_TABLET | Freq: Every day | ORAL | 0 refills | Status: AC

## 2016-08-27 MED ORDER — ESCITALOPRAM OXALATE 10 MG PO TABS: 10.0000 mg | ORAL_TABLET | Freq: Every day | ORAL | 1 refills | Status: DC

## 2016-08-27 NOTE — Addendum Note (Signed)
Addended by: Jeanine LuzALONE, GREGORY D on: 08/27/2016 05:42 PM   Modules accepted: Orders

## 2016-10-20 ENCOUNTER — Other Ambulatory Visit: Payer: Self-pay | Admitting: Family

## 2016-10-29 ENCOUNTER — Encounter: Payer: Self-pay | Admitting: Internal Medicine

## 2016-10-29 MED ORDER — ESCITALOPRAM OXALATE 10 MG PO TABS: 10.0000 mg | ORAL_TABLET | Freq: Every day | ORAL | 1 refills | Status: DC

## 2017-02-28 ENCOUNTER — Encounter: Payer: Self-pay | Admitting: Internal Medicine

## 2017-05-24 ENCOUNTER — Other Ambulatory Visit: Payer: Self-pay | Admitting: Internal Medicine

## 2017-05-28 ENCOUNTER — Encounter: Payer: Self-pay | Admitting: Internal Medicine

## 2017-12-04 DIAGNOSIS — Z01419 Encounter for gynecological examination (general) (routine) without abnormal findings: Secondary | ICD-10-CM | POA: Diagnosis not present

## 2017-12-04 DIAGNOSIS — Z6826 Body mass index (BMI) 26.0-26.9, adult: Secondary | ICD-10-CM | POA: Diagnosis not present

## 2017-12-04 LAB — HM PAP SMEAR

## 2017-12-09 DIAGNOSIS — Z23 Encounter for immunization: Secondary | ICD-10-CM | POA: Diagnosis not present

## 2018-01-31 DIAGNOSIS — J019 Acute sinusitis, unspecified: Secondary | ICD-10-CM | POA: Diagnosis not present

## 2018-02-10 ENCOUNTER — Encounter: Payer: Self-pay | Admitting: Internal Medicine

## 2018-05-19 ENCOUNTER — Encounter: Payer: Self-pay | Admitting: Internal Medicine

## 2018-05-20 MED ORDER — BUSPIRONE HCL 5 MG PO TABS: 5.0000 mg | ORAL_TABLET | Freq: Two times a day (BID) | ORAL | 0 refills | Status: DC | PRN

## 2018-06-02 NOTE — Progress Notes (Signed)
[name] [address] [place], [place] [address] Phone: [phone] Subjective:   Bruce Donath, am serving as a scribe for Dr. [name].  I'm seeing this patient by the request  of:  Myrlene Broker, MD   CC: calf pain   NOI:[id number]  Pamela Barnes is a 33 y.o. female coming in with complaint of left calf pain since December. Pain over medial tibia and into the calf. Pain increases with running, worse with inclines and if walking prolonged distances. Had had cupping and used kinesiotaping which has helped. Uses IBU and ice. Has been foam rolling. Was running 6 miles but is down to 2 to 3 miles. Pain starts 1 mile in. Patient was running in [place] [employer]. Is now running in [employer]. Is now running in [employer] following an assessment from [employer].       Past Medical History:  Diagnosis Date  . Allergy   . Anxiety   . Syncope   . Vaso vagal episode    No past surgical history on file. Social History   Socioeconomic History  . Marital status: Single    Spouse name: Not on file  . Number of children: Not on file  . Years of education: Not on file  . Highest education level: Not on file  Occupational History  . Not on file  Social Needs  . Financial resource strain: Not on file  . Food insecurity:    Worry: Not on file    Inability: Not on file  . Transportation needs:    Medical: Not on file    Non-medical: Not on file  Tobacco Use  . Smoking status: Never Smoker  . Smokeless tobacco: Never Used  Substance and Sexual Activity  . Alcohol use: Yes    Alcohol/week: 4.0 standard drinks    Types: 2 Glasses of wine, 2 Cans of beer per week  . Drug use: No  . Sexual activity: Yes    Birth control/protection: Condom  Lifestyle  . Physical activity:    Days per week: Not on file    Minutes per session: Not on file  . Stress: Not on file  Relationships  . Social connections:    Talks on phone: Not on file     Gets together: Not on file    Attends religious service: Not on file    Active member of club or organization: Not on file    Attends meetings of clubs or organizations: Not on file    Relationship status: Not on file  Other Topics Concern  . Not on file  Social History Narrative  . Not on file   Allergies  Allergen Reactions  . Dust Mite Extract   . Amoxicillin Rash and Hives   Family History  Problem Relation Age of Onset  . Hyperlipidemia Mother   . Hypertension Mother   . Heart disease Father   . Cancer Maternal Grandfather        skin  . Heart disease Maternal Grandfather   . Heart disease Paternal Grandmother   . Cancer Paternal Grandfather        skin    Current Outpatient Medications (Endocrine & Metabolic):  .  desogestrel-ethinyl estradiol (APRI) 0.15-30 MG-MCG tablet, Take 1 tablet by mouth daily.   Current Outpatient Medications (Respiratory):  .  guaiFENesin (MUCINEX) 600 MG 12 hr tablet, Take 600 mg by mouth 2 (two) times daily as needed. For congestion  Current  Outpatient Medications (Analgesics):  .  ibuprofen (ADVIL,MOTRIN) 800 MG tablet, Take 800 mg by mouth daily as needed. .  naproxen (NAPROSYN) 250 MG tablet, Take 250 mg by mouth 2 (two) times daily with a meal.   Current Outpatient Medications (Other):  .  busPIRone (BUSPAR) 5 MG tablet, Take 1 tablet (5 mg total) by mouth 2 (two) times daily as needed (anxiety). Marland Kitchen  escitalopram (LEXAPRO) 10 MG tablet, Take 1 tablet (10 mg total) by mouth daily. Need annual appointment for further refills .  Vitamin D, Ergocalciferol, (DRISDOL) 1.25 MG (50000 UT) CAPS capsule, Take 1 capsule (50,000 Units total) by mouth every 7 (seven) days.    Past medical history, social, surgical and family history all reviewed in electronic medical record.  No pertanent information unless stated regarding to the chief complaint.   Review of Systems:  No headache, visual changes, nausea, vomiting, diarrhea, constipation,  dizziness, abdominal pain, skin rash, fevers, chills, night sweats, weight loss, swollen lymph nodes, body aches, joint swelling, muscle aches, chest pain, shortness of breath, mood changes.   Objective  Blood pressure 110/82, pulse 72, height 5' 7.5" (1.715 m), weight 168 lb (76.2 kg), SpO2 99 %.    General: No apparent distress alert and oriented x3 mood and affect normal, dressed appropriately.  HEENT: Pupils equal, extraocular movements intact  Respiratory: Patient's speak in full sentences and does not appear short of breath  Cardiovascular: No lower extremity edema, non tender, no erythema  Skin: Warm dry intact with no signs of infection or rash on extremities or on axial skeleton.  Abdomen: Soft nontender  Neuro: Cranial nerves II through XII are intact, neurovascularly intact in all extremities with 2+ DTRs and 2+ pulses.  Lymph: No lymphadenopathy of posterior or anterior cervical chain or axillae bilaterally.  Gait normal with good balance and coordination.  MSK:  Non tender with full range of motion and good stability and symmetric strength and tone of shoulders, elbows, wrist, hip, knee and bilaterally.  Ankle: left ankle No visible erythema or swelling. Range of motion is full in all directions. Strength is 5/5 in all directions. Stable lateral and medial ligaments; squeeze test and kleiger test unremarkable; Talar dome nontender; Mild pain with pronation Able to walk 4 steps. Contralateral ankle also shows mild pronation  MSK US performed of: Left ankle This study was ordered, performed, and interpreted by Terrilee Files D.O.  Foot/Ankle:   Is doing tibialis tendon.  Not sure patient is no significant cortical defect or signs of any type of stress reaction but patient does have some mild narrowing of the cortex.  IMPRESSION: Posterior tibialis tendinitis  97110; 15 additional minutes spent for Therapeutic exercises as stated in above notes.  This included exercises  focusing on stretching, strengthening, with significant focus on eccentric aspects.   Long term goals include an improvement in range of motion, strength, endurance as well as avoiding reinjury. Patient's frequency would include in 1-2 times a day, 3-5 times a week for a duration of 6-12 weeks. Ankle strengthening that included:  Basic range of motion exercises to allow proper full motion at ankle Stretching of the lower leg and hamstrings  Theraband exercises for the lower leg - inversion, eversion, dorsiflexion and plantarflexion each to be completed with a theraband Balance exercises to increase proprioception Weight bearing exercises to increase strength and balance  Proper technique shown and discussed handout in great detail with ATC.  All questions were discussed and answered.     Impression  and Recommendations:     This case required medical decision making of moderate complexity. The above documentation has been reviewed and is accurate and complete Lyndal Pulley, DO       Note: This dictation was prepared with Dragon dictation along with smaller phrase technology. Any transcriptional errors that result from this process are unintentional.

## 2018-06-03 ENCOUNTER — Ambulatory Visit: Payer: Self-pay

## 2018-06-03 ENCOUNTER — Other Ambulatory Visit: Payer: Self-pay

## 2018-06-03 ENCOUNTER — Ambulatory Visit: Payer: BLUE CROSS/BLUE SHIELD | Admitting: Family Medicine

## 2018-06-03 ENCOUNTER — Encounter: Payer: Self-pay | Admitting: Family Medicine

## 2018-06-03 VITALS — BP 110/82 | HR 72 | Ht 67.5 in | Wt 168.0 lb

## 2018-06-03 DIAGNOSIS — M79662 Pain in left lower leg: Secondary | ICD-10-CM

## 2018-06-03 DIAGNOSIS — S86112A Strain of other muscle(s) and tendon(s) of posterior muscle group at lower leg level, left leg, initial encounter: Secondary | ICD-10-CM

## 2018-06-03 MED ORDER — DICLOFENAC SODIUM 2 % TD SOLN: 2.0000 g | Freq: Two times a day (BID) | TRANSDERMAL | 3 refills | Status: AC

## 2018-06-03 MED ORDER — VITAMIN D (ERGOCALCIFEROL) 1.25 MG (50000 UNIT) PO CAPS: 50000.0000 [IU] | ORAL_CAPSULE | ORAL | 0 refills | Status: DC

## 2018-06-03 NOTE — Assessment & Plan Note (Signed)
Posterior tibialis tendinitis.  Discussed icing regimen and home exercise, discussed which activities to do which wants to avoid.  Increase activity slowly over the course the next several weeks.  Discussed icing regimen.  Topical anti-inflammatories prescribed.  Follow-up with me again in 4 to 8 weeks

## 2018-06-03 NOTE — Patient Instructions (Signed)
Good to meet you  Good luck with your dog.  Ice 20 minutes 2 times daily. Usually after activity and before bed. Exercises 3 times a week.  Heel lift 1/16 to 1/8 inch in your regular shoes maybe not running Try running in compression socks and even consider daily short term  pennsaid pinkie amount topically 2 times daily as needed.  Once weekly vitamin D for 2 months to help muscle strength  See me again in 6ish weeks

## 2018-06-04 ENCOUNTER — Other Ambulatory Visit: Payer: Self-pay | Admitting: Internal Medicine

## 2018-06-05 ENCOUNTER — Encounter: Payer: Self-pay | Admitting: Internal Medicine

## 2018-06-06 NOTE — Telephone Encounter (Addendum)
Left mess for patient to call back to offer virtual visit with Dr. Okey Dupre per PCP since last OV with PCP was 07/06/16. CRM created.

## 2018-06-09 ENCOUNTER — Encounter: Payer: Self-pay | Admitting: Internal Medicine

## 2018-06-09 ENCOUNTER — Ambulatory Visit (INDEPENDENT_AMBULATORY_CARE_PROVIDER_SITE_OTHER): Payer: BLUE CROSS/BLUE SHIELD | Admitting: Internal Medicine

## 2018-06-09 DIAGNOSIS — F4323 Adjustment disorder with mixed anxiety and depressed mood: Secondary | ICD-10-CM

## 2018-06-09 MED ORDER — BUSPIRONE HCL 5 MG PO TABS: 5.0000 mg | ORAL_TABLET | Freq: Two times a day (BID) | ORAL | 11 refills | Status: DC | PRN

## 2018-06-09 NOTE — Assessment & Plan Note (Signed)
Rx for buspar refilled today and can use as needed for the next several months. Reinforced good coping skills and exercise to help as well.

## 2018-06-09 NOTE — Progress Notes (Signed)
Virtual Visit via Video Note  I connected with Pamela Barnes on 06/09/18 at 10:20 AM EDT by a video enabled telemedicine application and verified that I am speaking with the correct person using two identifiers.   I discussed the limitations of evaluation and management by telemedicine and the availability of in person appointments. The patient expressed understanding and agreed to proceed.  History of Present Illness: The patient is a 33 y.o. YO female with visit for anxiety. Started years ago but had been well controlled. She tried lexapro last year and this caused grogginess and she was not able to take. She had been taking otc mood supplements with good luck. [place] weeks ago and this has caused some worsened anxiety as well as the recent coronavirus outbreak with all the uncertainty. She got rx for buspar to try and has had good success with this. Denies side effects. Helping with anxiety. Denies SI/HI. Is also exercising and trying coping strategies.  Observations/Objective: Appearance: normal, breathing appears normal, good grooming, abdomen does not appear distended, throat not examined, memory normal, mental status is appropriate  Assessment and Plan: See problem oriented charting  Follow Up Instructions: 6-8 months for physical  I discussed the assessment and treatment plan with the patient. The patient was provided an opportunity to ask questions and all were answered. The patient agreed with the plan and demonstrated an understanding of the instructions.   The patient was advised to call back or seek an in-person evaluation if the symptoms worsen or if the condition fails to improve as anticipated.  I provided 15 minutes of non-face-to-face time during this encounter.  Myrlene Broker, MD

## 2018-07-03 ENCOUNTER — Other Ambulatory Visit: Payer: Self-pay | Admitting: Internal Medicine

## 2018-07-14 NOTE — Progress Notes (Deleted)
[facility] D.[facility] [address] [place], [place] [address] Phone: [phone](336) [phone] Subjective:     CC: right ankle pain f/u   BJY:[id number]:Subjective  Pamela Barnes is a 33 y.o. female coming in with complaint of right ankle pain.  Patient was seen previously and diagnosed with posterior tibialis tendinitis.  Patient was to do home exercises, over-the-counter orthotics.  Patient states     Past Medical History:  Diagnosis Date  . Allergy   . Anxiety   . Syncope   . Vaso vagal episode    No past surgical history on file. Social History   Socioeconomic History  . Marital status: Single    Spouse name: Not on file  . Number of children: Not on file  . Years of education: Not on file  . Highest education level: Not on file  Occupational History  . Not on file  Social Needs  . Financial resource strain: Not on file  . Food insecurity:    Worry: Not on file    Inability: Not on file  . Transportation needs:    Medical: Not on file    Non-medical: Not on file  Tobacco Use  . Smoking status: Never Smoker  . Smokeless tobacco: Never Used  Substance and Sexual Activity  . Alcohol use: Yes    Alcohol/week: 4.0 standard drinks    Types: 2 Glasses of wine, 2 Cans of beer per week  . Drug use: No  . Sexual activity: Yes    Birth control/protection: Condom  Lifestyle  . Physical activity:    Days per week: Not on file    Minutes per session: Not on file  . Stress: Not on file  Relationships  . Social connections:    Talks on phone: Not on file    Gets together: Not on file    Attends religious service: Not on file    Active member of club or organization: Not on file    Attends meetings of clubs or organizations: Not on file    Relationship status: Not on file  Other Topics Concern  . Not on file  Social History Narrative  . Not on file   Allergies  Allergen Reactions  . Dust Mite Extract   . Amoxicillin Rash and Hives   Family History  Problem  Relation Age of Onset  . Hyperlipidemia Mother   . Hypertension Mother   . Heart disease Father   . Cancer Maternal Grandfather        skin  . Heart disease Maternal Grandfather   . Heart disease Paternal Grandmother   . Cancer Paternal Grandfather        skin    Current Outpatient Medications (Endocrine & Metabolic):  .  desogestrel-ethinyl estradiol (APRI) 0.15-30 MG-MCG tablet, Take 1 tablet by mouth daily.    Current Outpatient Medications (Analgesics):  .  ibuprofen (ADVIL,MOTRIN) 800 MG tablet, Take 800 mg by mouth daily as needed.   Current Outpatient Medications (Other):  .  busPIRone (BUSPAR) 5 MG tablet, TAKE 1 TABLET TWICE A DAY AS NEEDED FOR ANXIETY .  Diclofenac Sodium 2 % SOLN, Place 2 g onto the skin 2 (two) times daily. .  Vitamin D, Ergocalciferol, (DRISDOL) 1.25 MG (50000 UT) CAPS capsule, Take 1 capsule (50,000 Units total) by mouth every 7 (seven) days.    Past medical history, social, surgical and family history all reviewed in electronic medical record.  No pertanent information unless stated regarding to the chief  complaint.   Review of Systems:  No headache, visual changes, nausea, vomiting, diarrhea, constipation, dizziness, abdominal pain, skin rash, fevers, chills, night sweats, weight loss, swollen lymph nodes, body aches, joint swelling, muscle aches, chest pain, shortness of breath, mood changes.   Objective  There were no vitals taken for this visit. Systems examined below as of    General: No apparent distress alert and oriented x3 mood and affect normal, dressed appropriately.  HEENT: Pupils equal, extraocular movements intact  Respiratory: Patient's speak in full sentences and does not appear short of breath  Cardiovascular: No lower extremity edema, non tender, no erythema  Skin: Warm dry intact with no signs of infection or rash on extremities or on axial skeleton.  Abdomen: Soft nontender  Neuro: Cranial nerves II through XII are intact,  neurovascularly intact in all extremities with 2+ DTRs and 2+ pulses.  Lymph: No lymphadenopathy of posterior or anterior cervical chain or axillae bilaterally.  Gait normal with good balance and coordination.  MSK:  Non tender with full range of motion and good stability and symmetric strength and tone of shoulders, elbows, wrist, hip, knee bilaterally.  Ankle: No visible erythema or swelling. Range of motion is full in all directions. Strength is 5/5 in all directions. Stable lateral and medial ligaments; squeeze test and kleiger test unremarkable; Talar dome nontender; No pain at base of 5th MT; No tenderness over cuboid; No tenderness over N spot or navicular prominence No tenderness on posterior aspects of lateral and medial malleolus No sign of peroneal tendon subluxations or tenderness to palpation Negative tarsal tunnel tinel's Able to walk 4 steps.   Impression and Recommendations:     This case required medical decision making of moderate complexity. The above documentation has been reviewed and is accurate and complete Judi Saa, DO       Note: This dictation was prepared with Dragon dictation along with smaller phrase technology. Any transcriptional errors that result from this process are unintentional.

## 2018-07-15 ENCOUNTER — Ambulatory Visit: Payer: Self-pay | Admitting: Family Medicine

## 2018-07-24 ENCOUNTER — Other Ambulatory Visit: Payer: Self-pay | Admitting: Family Medicine

## 2018-07-24 NOTE — Telephone Encounter (Signed)
Refill denied. Pt has finished course of treatment.  

## 2018-08-05 ENCOUNTER — Encounter: Payer: Self-pay | Admitting: Family Medicine

## 2018-08-12 ENCOUNTER — Encounter: Payer: Self-pay | Admitting: Family Medicine

## 2018-08-12 ENCOUNTER — Other Ambulatory Visit: Payer: Self-pay

## 2018-08-12 ENCOUNTER — Ambulatory Visit (INDEPENDENT_AMBULATORY_CARE_PROVIDER_SITE_OTHER): Payer: BC Managed Care – PPO | Admitting: Family Medicine

## 2018-08-12 VITALS — BP 126/72 | HR 76 | Ht 67.5 in | Wt 166.0 lb

## 2018-08-12 DIAGNOSIS — M533 Sacrococcygeal disorders, not elsewhere classified: Secondary | ICD-10-CM | POA: Diagnosis not present

## 2018-08-12 DIAGNOSIS — M999 Biomechanical lesion, unspecified: Secondary | ICD-10-CM | POA: Insufficient documentation

## 2018-08-12 DIAGNOSIS — G5752 Tarsal tunnel syndrome, left lower limb: Secondary | ICD-10-CM | POA: Insufficient documentation

## 2018-08-12 MED ORDER — GABAPENTIN 100 MG PO CAPS: 100.0000 mg | ORAL_CAPSULE | Freq: Three times a day (TID) | ORAL | 3 refills | Status: DC

## 2018-08-12 NOTE — Assessment & Plan Note (Signed)
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  Is making significant progress when he concerns more of a tendinitis.  Patient will continue to run at this point.  Concerned that some of this could be more radicular symptoms.  Possible impingement noted in the versus the back.  Attempted osteopathic manipulation today.  Started gabapentin.  Following up again in 4 to 6 weeks

## 2018-08-12 NOTE — Patient Instructions (Addendum)
Check iron on your multivitamin Follow up with [place] in 4 weeks  Exercise 3 times a week

## 2018-08-12 NOTE — Progress Notes (Signed)
[facility] Scale[facility] Johnedward Brodrick D.O. [facility] [address] [place], [place] [address] Phone: [phone]) [phone] Subjective:   I Pamela NighKana Barnes am serving as a Neurosurgeonscribe for Dr. Antoine PrimasZachary Shaquanta Harkless.  I'm seeing this patient by the request  of:  Myrlene Brokerrawford, Elizabeth A, MD   CC: Left ankle pain follow-up  BJY:NWGNFAOZHYHPI:Subjective  Pamela LahRachel Barnes is a 33 y.o. female coming in with complaint of left ankle pain. States her pain comes and goes. Does her exercises, compression socks with running, icing etc. Her last run Friday was good and she had no pain. Medial calf pain that radiates to the ankle. Believes she also has sciatica. Feels sciatic pain when she runs.   Patient previously June 03, 2018.  Diagnosed with more of a posterior tibialis tendinitis patient has been doing home exercises, increase her running, no increasing discomfort.  Has noticed more tightness in the calf.  Concerned he may be coming more from her back.  Past Medical History:  Diagnosis Date  . Allergy   . Anxiety   . Syncope   . Vaso vagal episode    No past surgical history on file. Social History   Socioeconomic History  . Marital status: Single    Spouse name: Not on file  . Number of children: Not on file  . Years of education: Not on file  . Highest education level: Not on file  Occupational History  . Not on file  Social Needs  . Financial resource strain: Not on file  . Food insecurity:    Worry: Not on file    Inability: Not on file  . Transportation needs:    Medical: Not on file    Non-medical: Not on file  Tobacco Use  . Smoking status: Never Smoker  . Smokeless tobacco: Never Used  Substance and Sexual Activity  . Alcohol use: Yes    Alcohol/week: 4.0 standard drinks    Types: 2 Glasses of wine, 2 Cans of beer per week  . Drug use: No  . Sexual activity: Yes    Birth control/protection: Condom  Lifestyle  . Physical activity:    Days per week: Not on file    Minutes per session: Not on file  . Stress: Not on file   Relationships  . Social connections:    Talks on phone: Not on file    Gets together: Not on file    Attends religious service: Not on file    Active member of club or organization: Not on file    Attends meetings of clubs or organizations: Not on file    Relationship status: Not on file  Other Topics Concern  . Not on file  Social History Narrative  . Not on file   Allergies  Allergen Reactions  . Dust Mite Extract   . Amoxicillin Rash and Hives   Family History  Problem Relation Age of Onset  . Hyperlipidemia Mother   . Hypertension Mother   . Heart disease Father   . Cancer Maternal Grandfather        skin  . Heart disease Maternal Grandfather   . Heart disease Paternal Grandmother   . Cancer Paternal Grandfather        skin    Current Outpatient Medications (Endocrine & Metabolic):  .  desogestrel-ethinyl estradiol (APRI) 0.15-30 MG-MCG tablet, Take 1 tablet by mouth daily.    Current Outpatient Medications (Analgesics):  .  ibuprofen (ADVIL,MOTRIN) 800 MG tablet, Take 800 mg by mouth daily as needed.  Current Outpatient Medications (Other):  .  busPIRone (BUSPAR) 5 MG tablet, TAKE 1 TABLET TWICE A DAY AS NEEDED FOR ANXIETY .  Diclofenac Sodium 2 % SOLN, Place 2 g onto the skin 2 (two) times daily. .  Vitamin D, Ergocalciferol, (DRISDOL) 1.25 MG (50000 UT) CAPS capsule, Take 1 capsule (50,000 Units total) by mouth every 7 (seven) days. Marland Kitchen  gabapentin (NEURONTIN) 100 MG capsule, Take 1 capsule (100 mg total) by mouth 3 (three) times daily.    Past medical history, social, surgical and family history all reviewed in electronic medical record.  No pertanent information unless stated regarding to the chief complaint.   Review of Systems:  No headache, visual changes, nausea, vomiting, diarrhea, constipation, dizziness, abdominal pain, skin rash, fevers, chills, night sweats, weight loss, swollen lymph nodes, body aches, joint swelling, muscle aches, chest pain,  shortness of breath, mood changes.   Objective  Blood pressure 126/72, pulse 76, height 5' 7.5" (1.715 m), weight 166 lb (75.3 kg), SpO2 98 %.     General: No apparent distress alert and oriented x3 mood and affect normal, dressed appropriately.  HEENT: Pupils equal, extraocular movements intact  Respiratory: Patient's speak in full sentences and does not appear short of breath  Cardiovascular: No lower extremity edema, non tender, no erythema  Skin: Warm dry intact with no signs of infection or rash on extremities or on axial skeleton.  Abdomen: Soft nontender  Neuro: Cranial nerves II through XII are intact, neurovascularly intact in all extremities with 2+ DTRs and 2+ pulses.  Lymph: No lymphadenopathy of posterior or anterior cervical chain or axillae bilaterally.  Gait normal with good balance and coordination.  MSK:  Non tender with full range of motion and good stability and symmetric strength and tone of shoulders, elbows, wrist, hip, knee bilaterally.  Ankle: Left No visible erythema or swelling. Range of motion is full in all directions. Strength is 5/5 in all directions. Stable lateral and medial ligaments; squeeze test and kleiger test unremarkable; Talar dome nontender; No pain at base of 5th MT; No tenderness over cuboid; No tenderness over N spot or navicular prominence No tenderness on posterior aspects of lateral and medial malleolus No sign of peroneal tendon subluxations or tenderness to palpation Negative tarsal tunnel tinel's Able to walk 4 steps.    Patient noted does have tightness in the left calf muscle.  Back exam does have mild loss of lordosis.  Tender to palpation over the left sacroiliac joint.  Mild positive Pearlean Brownie test on the left.  Negative straight leg test.  Mild tightness of the hip flexors bilaterally left greater than right.  Near full range of motion otherwise.  5-5 strength of the lower extremities  Impression and Recommendations:     This  case required medical decision making of moderate complexity. The above documentation has been reviewed and is accurate and complete Judi Saa, DO       Note: This dictation was prepared with Dragon dictation along with smaller phrase technology. Any transcriptional errors that result from this process are unintentional.

## 2018-08-12 NOTE — Assessment & Plan Note (Signed)
Decision today to treat with OMT was based on Physical Exam  After verbal consent patient was treated with HVLA, ME, FPR techniques in  thoracic, lumbar and sacral areas  Patient tolerated the procedure well with improvement in symptoms  Patient given exercises, stretches and lifestyle modifications  See medications in patient instructions if given  Patient will follow up in 4-6 weeks 

## 2018-08-12 NOTE — Assessment & Plan Note (Signed)
patient does have some mild left-sided sacroiliac dysfunction.  Could be causing some piriformis tendon nerve impingement.  Started on the gabapentin.  Discussed home exercises icing regimen.  Discussed hip abductor strengthening.  Started on gabapentin.  Follow-up again in 4 to 6 weeks

## 2018-09-06 ENCOUNTER — Other Ambulatory Visit: Payer: Self-pay | Admitting: Family Medicine

## 2018-09-14 NOTE — Assessment & Plan Note (Signed)
Decision today to treat with OMT was based on Physical Exam  After verbal consent patient was treated with HVLA, ME, FPR techniques in , thoracic, lumbar and sacral areas  Patient tolerated the procedure well with improvement in symptoms  Patient given exercises, stretches and lifestyle modifications  See medications in patient instructions if given  Patient will follow up in 4-8 weeks 

## 2018-09-14 NOTE — Progress Notes (Signed)
Tawana ScaleZach Trey Gulbranson D.O.  Sports Medicine [address] [place], [place] [address] Phone: [phone] Subjective:    I'm seeing this patient by the request  of:    CC: Back pain, calf pain follow-up  BJY:[id number]:Subjective    09/15/2018 Pamela Barnes is a 33 y.o. female coming in with complaint of left calf pain. States her ankle is good some days and bad others. Hills and speed work is painful compared to longer distances. States she stretches before and after. Medial/ mid calf pain.  Still there but not quite as intense as was previously.     Past Medical History:  Diagnosis Date  . Allergy   . Anxiety   . Syncope   . Vaso vagal episode    No past surgical history on file. Social History   Socioeconomic History  . Marital status: Single    Spouse name: Not on file  . Number of children: Not on file  . Years of education: Not on file  . Highest education level: Not on file  Occupational History  . Not on file  Social Needs  . Financial resource strain: Not on file  . Food insecurity    Worry: Not on file    Inability: Not on file  . Transportation needs    Medical: Not on file    Non-medical: Not on file  Tobacco Use  . Smoking status: Never Smoker  . Smokeless tobacco: Never Used  Substance and Sexual Activity  . Alcohol use: Yes    Alcohol/week: 4.0 standard drinks    Types: 2 Glasses of wine, 2 Cans of beer per week  . Drug use: No  . Sexual activity: Yes    Birth control/protection: Condom  Lifestyle  . Physical activity    Days per week: Not on file    Minutes per session: Not on file  . Stress: Not on file  Relationships  . Social Musicianconnections    Talks on phone: Not on file    Gets together: Not on file    Attends religious service: Not on file    Active member of club or organization: Not on file    Attends meetings of clubs or organizations: Not on file    Relationship status: Not on file  Other Topics Concern  . Not on file  Social History Narrative   . Not on file   Allergies  Allergen Reactions  . Dust Mite Extract   . Amoxicillin Rash and Hives   Family History  Problem Relation Age of Onset  . Hyperlipidemia Mother   . Hypertension Mother   . Heart disease Father   . Cancer Maternal Grandfather        skin  . Heart disease Maternal Grandfather   . Heart disease Paternal Grandmother   . Cancer Paternal Grandfather        skin    Current Outpatient Medications (Endocrine & Metabolic):  .  desogestrel-ethinyl estradiol (APRI) 0.15-30 MG-MCG tablet, Take 1 tablet by mouth daily.    Current Outpatient Medications (Analgesics):  .  ibuprofen (ADVIL,MOTRIN) 800 MG tablet, Take 800 mg by mouth daily as needed.   Current Outpatient Medications (Other):  .  busPIRone (BUSPAR) 5 MG tablet, TAKE 1 TABLET TWICE A DAY AS NEEDED FOR ANXIETY .  Diclofenac Sodium 2 % SOLN, Place 2 g onto the skin 2 (two) times daily. Marland Kitchen.  gabapentin (NEURONTIN) 100 MG capsule, TAKE 1 CAPSULE BY MOUTH THREE TIMES A DAY .  Vitamin  D, Ergocalciferol, (DRISDOL) 1.25 MG (50000 UT) CAPS capsule, Take 1 capsule (50,000 Units total) by mouth every 7 (seven) days.    Past medical history, social, surgical and family history all reviewed in electronic medical record.  No pertanent information unless stated regarding to the chief complaint.   Review of Systems:  No headache, visual changes, nausea, vomiting, diarrhea, constipation, dizziness, abdominal pain, skin rash, fevers, chills, night sweats, weight loss, swollen lymph nodes, body aches, joint swelling, , chest pain, shortness of breath, mood changes.  Positive muscle aches  Objective  Blood pressure 110/70, pulse 74, height 5' 7.5" (1.715 m), weight 163 lb (73.9 kg), SpO2 97 %.   General: No apparent distress alert and oriented x3 mood and affect normal, dressed appropriately.  HEENT: Pupils equal, extraocular movements intact  Respiratory: Patient's speak in full sentences and does not appear short  of breath  Cardiovascular: No lower extremity edema, non tender, no erythema  Skin: Warm dry intact with no signs of infection or rash on extremities or on axial skeleton.  Abdomen: Soft nontender  Neuro: Cranial nerves II through XII are intact, neurovascularly intact in all extremities with 2+ DTRs and 2+ pulses.  Lymph: No lymphadenopathy of posterior or anterior cervical chain or axillae bilaterally.  Gait normal with good balance and coordination.  MSK:  Non tender with full range of motion and good stability and symmetric strength and tone of shoulders, elbows, wrist, hip, knee and ankles bilaterally.  Back Exam:  Inspection: Unremarkable  Motion: Flexion 45 deg, Extension 45 deg, Side Bending to 45 deg bilaterally,  Rotation to 45 deg bilaterally  SLR laying: Negative  XSLR laying: Negative  Palpable tenderness: Tender to palpation of paraspinal musculature lumbar spine left greater than right t. FABER: Tightness on the left. Sensory change: Gross sensation intact to all lumbar and sacral dermatomes.  Reflexes: 2+ at both patellar tendons, 2+ at achilles tendons, Babinski's downgoing.  Strength at foot  Plantar-flexion: 5/5 Dorsi-flexion: 5/5 Eversion: 5/5 Inversion: 5/5  Leg strength  Quad: 5/5 Hamstring: 5/5 Hip flexor: 5/5 Hip abductors: 5/5  Gait unremarkable.  Osteopathic findings  T9 extended rotated and side bent left L2 flexed rotated and side bent right Sacrum left on left    Impression and Recommendations:     This case required medical decision making of moderate complexity. The above documentation has been reviewed and is accurate and complete Lyndal Pulley, DO       Note: This dictation was prepared with Dragon dictation along with smaller phrase technology. Any transcriptional errors that result from this process are unintentional.

## 2018-09-14 NOTE — Assessment & Plan Note (Signed)
Continues to have some discomfort and pain.  We discussed ergonomics, hip abductor strengthening, icing regimen.  Discussed the gabapentin again.  Topical anti-inflammatories.  Attempted osteopathic manipulation again.  Follow-up again 4 to 8 weeks

## 2018-09-15 ENCOUNTER — Other Ambulatory Visit: Payer: Self-pay

## 2018-09-15 ENCOUNTER — Encounter: Payer: Self-pay | Admitting: Family Medicine

## 2018-09-15 ENCOUNTER — Ambulatory Visit: Payer: BC Managed Care – PPO | Admitting: Family Medicine

## 2018-09-15 VITALS — BP 110/70 | HR 74 | Ht 67.5 in | Wt 163.0 lb

## 2018-09-15 DIAGNOSIS — M999 Biomechanical lesion, unspecified: Secondary | ICD-10-CM

## 2018-09-15 DIAGNOSIS — M533 Sacrococcygeal disorders, not elsewhere classified: Secondary | ICD-10-CM | POA: Diagnosis not present

## 2018-09-15 NOTE — Patient Instructions (Signed)
Good to see you Keep monitoring the calf  See you again in 5 weeks

## 2018-10-20 ENCOUNTER — Encounter: Payer: Self-pay | Admitting: Internal Medicine

## 2018-10-24 NOTE — Progress Notes (Signed)
Pamela ScaleZach Barnes D.O. [facility] [address] [place], [place] [address] Phone: [phone] Subjective:   I Pamela NighKana Barnes am serving as a Neurosurgeonscribe for Dr. Antoine PrimasZachary Barnes.   CC: Back pain follow-up  BJY:NWGNFAOZHYHPI:Subjective  Pamela LahRachel Barnes is a 33 y.o. female coming in with complaint of back pain. States she is doing much better. Running is almost pain free. Patient is doing significantly better overall.  Patient did have a personal record with her 5K recently.  Patient is doing well with some very mild discomfort and pain from time to time but nothing severe.  Nothing that is stopping her from activity.  Very happy with the result so far.    Past Medical History:  Diagnosis Date  . Allergy   . Anxiety   . Syncope   . Vaso vagal episode    No past surgical history on file. Social History   Socioeconomic History  . Marital status: Single    Spouse name: Not on file  . Number of children: Not on file  . Years of education: Not on file  . Highest education level: Not on file  Occupational History  . Not on file  Social Needs  . Financial resource strain: Not on file  . Food insecurity    Worry: Not on file    Inability: Not on file  . Transportation needs    Medical: Not on file    Non-medical: Not on file  Tobacco Use  . Smoking status: Never Smoker  . Smokeless tobacco: Never Used  Substance and Sexual Activity  . Alcohol use: Yes    Alcohol/week: 4.0 standard drinks    Types: 2 Glasses of wine, 2 Cans of beer per week  . Drug use: No  . Sexual activity: Yes    Birth control/protection: Condom  Lifestyle  . Physical activity    Days per week: Not on file    Minutes per session: Not on file  . Stress: Not on file  Relationships  . Social Musicianconnections    Talks on phone: Not on file    Gets together: Not on file    Attends religious service: Not on file    Active member of club or organization: Not on file    Attends meetings of clubs or organizations: Not on file    Relationship status: Not on file  Other Topics Concern  . Not on file  Social History Narrative  . Not on file   Allergies  Allergen Reactions  . Dust Mite Extract   . Amoxicillin Rash and Hives   Family History  Problem Relation Age of Onset  . Hyperlipidemia Mother   . Hypertension Mother   . Heart disease Father   . Cancer Maternal Grandfather        skin  . Heart disease Maternal Grandfather   . Heart disease Paternal Grandmother   . Cancer Paternal Grandfather        skin    Current Outpatient Medications (Endocrine & Metabolic):  .  desogestrel-ethinyl estradiol (APRI) 0.15-30 MG-MCG tablet, Take 1 tablet by mouth daily.    Current Outpatient Medications (Analgesics):  .  ibuprofen (ADVIL,MOTRIN) 800 MG tablet, Take 800 mg by mouth daily as needed.   Current Outpatient Medications (Other):  .  busPIRone (BUSPAR) 5 MG tablet, TAKE 1 TABLET TWICE A DAY AS NEEDED FOR ANXIETY .  Diclofenac Sodium 2 % SOLN, Place 2 g onto the skin 2 (two) times daily. Marland Kitchen.  gabapentin (NEURONTIN)  100 MG capsule, TAKE 1 CAPSULE BY MOUTH THREE TIMES A DAY .  Vitamin D, Ergocalciferol, (DRISDOL) 1.25 MG (50000 UT) CAPS capsule, Take 1 capsule (50,000 Units total) by mouth every 7 (seven) days.    Past medical history, social, surgical and family history all reviewed in electronic medical record.  No pertanent information unless stated regarding to the chief complaint.   Review of Systems:  No headache, visual changes, nausea, vomiting, diarrhea, constipation, dizziness, abdominal pain, skin rash, fevers, chills, night sweats, weight loss, swollen lymph nodes, body aches, joint swelling, muscle aches, chest pain, shortness of breath, mood changes.   Objective  Blood pressure 100/70, pulse 70, height 5' 7.5" (1.715 m), weight 160 lb (72.6 kg), SpO2 97 %.    General: No apparent distress alert and oriented x3 mood and affect normal, dressed appropriately.  HEENT: Pupils equal, extraocular  movements intact  Respiratory: Patient's speak in full sentences and does not appear short of breath  Cardiovascular: No lower extremity edema, non tender, no erythema  Skin: Warm dry intact with no signs of infection or rash on extremities or on axial skeleton.  Abdomen: Soft nontender  Neuro: Cranial nerves II through XII are intact, neurovascularly intact in all extremities with 2+ DTRs and 2+ pulses.  Lymph: No lymphadenopathy of posterior or anterior cervical chain or axillae bilaterally.  Gait normal with good balance and coordination.  MSK:  Non tender with full range of motion and good stability and symmetric strength and tone of shoulders, elbows, wrist, hip, knee and ankles bilaterally.  Back Exam:  Inspection: Unremarkable  Motion: Flexion 45 deg, Extension 45 deg, Side Bending to 45 deg bilaterally,  Rotation to 45 deg bilaterally  SLR laying: Negative  XSLR laying: Negative  Palpable tenderness: Tender to palpation. FABER: negative. Sensory change: Gross sensation intact to all lumbar and sacral dermatomes.  Reflexes: 2+ at both patellar tendons, 2+ at achilles tendons, Babinski's downgoing.  Strength at foot  Plantar-flexion: 5/5 Dorsi-flexion: 5/5 Eversion: 5/5 Inversion: 5/5  Leg strength  Quad: 5/5 Hamstring: 5/5 Hip flexor: 5/5 Hip abductors: 5/5  Gait unremarkable.  Osteopathic findings   T6 extended rotated and side bent left L4 flexed rotated and side bent right Sacrum right on right    Impression and Recommendations:     This case required medical decision making of moderate complexity. The above documentation has been reviewed and is accurate and complete Lyndal Pulley, DO       Note: This dictation was prepared with Dragon dictation along with smaller phrase technology. Any transcriptional errors that result from this process are unintentional.

## 2018-10-27 ENCOUNTER — Encounter: Payer: Self-pay | Admitting: Family Medicine

## 2018-10-27 ENCOUNTER — Other Ambulatory Visit: Payer: Self-pay

## 2018-10-27 ENCOUNTER — Ambulatory Visit: Payer: BC Managed Care – PPO | Admitting: Family Medicine

## 2018-10-27 VITALS — BP 100/70 | HR 70 | Ht 67.5 in | Wt 160.0 lb

## 2018-10-27 DIAGNOSIS — M533 Sacrococcygeal disorders, not elsewhere classified: Secondary | ICD-10-CM | POA: Diagnosis not present

## 2018-10-27 DIAGNOSIS — M999 Biomechanical lesion, unspecified: Secondary | ICD-10-CM

## 2018-10-27 NOTE — Assessment & Plan Note (Signed)
Patient is normal.  Discussed with patient topical medication  RTC in 8 weeks

## 2018-10-27 NOTE — Assessment & Plan Note (Signed)
Decision today to treat with OMT was based on Physical Exam  After verbal consent patient was treated with HVLA, ME, FPR techniques in cervical, thoracic, lumbar and sacral areas  Patient tolerated the procedure well with improvement in symptoms  Patient given exercises, stretches and lifestyle modifications  See medications in patient instructions if given  Patient will follow up in 8 weeks 

## 2018-10-27 NOTE — Patient Instructions (Signed)
Good to see you  Stay active I am proud of you! Vitamin D 4000IU daily  See me again in 8 weeks

## 2018-12-04 DIAGNOSIS — F411 Generalized anxiety disorder: Secondary | ICD-10-CM | POA: Diagnosis not present

## 2018-12-11 DIAGNOSIS — F411 Generalized anxiety disorder: Secondary | ICD-10-CM | POA: Diagnosis not present

## 2018-12-18 DIAGNOSIS — F411 Generalized anxiety disorder: Secondary | ICD-10-CM | POA: Diagnosis not present

## 2018-12-24 ENCOUNTER — Encounter: Payer: Self-pay | Admitting: Family Medicine

## 2018-12-24 ENCOUNTER — Ambulatory Visit: Payer: Self-pay

## 2018-12-24 ENCOUNTER — Ambulatory Visit: Payer: BC Managed Care – PPO | Admitting: Family Medicine

## 2018-12-24 ENCOUNTER — Other Ambulatory Visit: Payer: Self-pay

## 2018-12-24 VITALS — BP 122/80 | HR 74 | Ht 67.5 in | Wt 159.0 lb

## 2018-12-24 DIAGNOSIS — M25572 Pain in left ankle and joints of left foot: Secondary | ICD-10-CM | POA: Diagnosis not present

## 2018-12-24 DIAGNOSIS — S86892A Other injury of other muscle(s) and tendon(s) at lower leg level, left leg, initial encounter: Secondary | ICD-10-CM | POA: Diagnosis not present

## 2018-12-24 DIAGNOSIS — M999 Biomechanical lesion, unspecified: Secondary | ICD-10-CM | POA: Diagnosis not present

## 2018-12-24 DIAGNOSIS — M533 Sacrococcygeal disorders, not elsewhere classified: Secondary | ICD-10-CM | POA: Diagnosis not present

## 2018-12-24 MED ORDER — VITAMIN D (ERGOCALCIFEROL) 1.25 MG (50000 UNIT) PO CAPS: 50000.0000 [IU] | ORAL_CAPSULE | ORAL | 0 refills | Status: DC

## 2018-12-24 NOTE — Progress Notes (Signed)
[facility] [place], [place] [address] Phone: [phone] Subjective:   Pamela Barnes, am serving as a scribe for Dr. Hulan Saas.  I'm seeing this patient by the request  of:    CC: Back pain follow-up new onset ankle pain left side  [id number]  Pamela Barnes is a 33 y.o. female coming in with complaint of back pain. Last seen on 10/27/2018 for OMT. Patient states that she has been having right scapula pain. Sees a massage therapist that says she has knots in that area. Is seeing chiropractor for manipulation once a month. Denies any radiating symptoms. Does use a mouse on the right side.   Has started running 5 days instead of 3 days a week. Is having medial ankle pain with cramping on the front of the ankle with walking. Patient has pain during and after running.  Patient states that it can be sore usually after activity.  Better with rest though and then symptoms worse after sitting for long amount of time    Past Medical History:  Diagnosis Date  . Allergy   . Anxiety   . Syncope   . Vaso vagal episode    Barnes past surgical history on file. Social History   Socioeconomic History  . Marital status: Single    Spouse name: Not on file  . Number of children: Not on file  . Years of education: Not on file  . Highest education level: Not on file  Occupational History  . Not on file  Social Needs  . Financial resource strain: Not on file  . Food insecurity    Worry: Not on file    Inability: Not on file  . Transportation needs    Medical: Not on file    Non-medical: Not on file  Tobacco Use  . Smoking status: Never Smoker  . Smokeless tobacco: Never Used  Substance and Sexual Activity  . Alcohol use: Yes    Alcohol/week: 4.0 standard drinks    Types: 2 Glasses of wine, 2 Cans of beer per week  . Drug use: Barnes  . Sexual activity: Yes    Birth control/protection: Condom  Lifestyle  . Physical activity    Days per week:  Not on file    Minutes per session: Not on file  . Stress: Not on file  Relationships  . Social Herbalist on phone: Not on file    Gets together: Not on file    Attends religious service: Not on file    Active member of club or organization: Not on file    Attends meetings of clubs or organizations: Not on file    Relationship status: Not on file  Other Topics Concern  . Not on file  Social History Narrative  . Not on file   Allergies  Allergen Reactions  . Dust Mite Extract   . Amoxicillin Rash and Hives   Family History  Problem Relation Age of Onset  . Hyperlipidemia Mother   . Hypertension Mother   . Heart disease Father   . Cancer Maternal Grandfather        skin  . Heart disease Maternal Grandfather   . Heart disease Paternal Grandmother   . Cancer Paternal Grandfather        skin    Current Outpatient Medications (Endocrine & Metabolic):  .  desogestrel-ethinyl estradiol (APRI) 0.15-30 MG-MCG tablet, Take 1 tablet by mouth daily.  Current Outpatient Medications (Analgesics):  .  ibuprofen (ADVIL,MOTRIN) 800 MG tablet, Take 800 mg by mouth daily as needed.   Current Outpatient Medications (Other):  .  busPIRone (BUSPAR) 5 MG tablet, TAKE 1 TABLET TWICE A DAY AS NEEDED FOR ANXIETY .  Diclofenac Sodium 2 % SOLN, Place 2 g onto the skin 2 (two) times daily. Marland Kitchen  gabapentin (NEURONTIN) 100 MG capsule, TAKE 1 CAPSULE BY MOUTH THREE TIMES A DAY .  Vitamin D, Ergocalciferol, (DRISDOL) 1.25 MG (50000 UT) CAPS capsule, Take 1 capsule (50,000 Units total) by mouth every 7 (seven) days. .  Vitamin D, Ergocalciferol, (DRISDOL) 1.25 MG (50000 UT) CAPS capsule, Take 1 capsule (50,000 Units total) by mouth every 7 (seven) days.    Past medical history, social, surgical and family history all reviewed in electronic medical record.  Barnes pertanent information unless stated regarding to the chief complaint.   Review of Systems:  Barnes headache, visual changes, nausea,  vomiting, diarrhea, constipation, dizziness, abdominal pain, skin rash, fevers, chills, night sweats, weight loss, swollen lymph nodes, body aches, joint swelling, , chest pain, shortness of breath, mood changes.  Positive muscle aches  Objective  Blood pressure 122/80, pulse 74, height 5' 7.5" (1.715 m), weight 159 lb (72.1 kg), SpO2 98 %.    General: Barnes apparent distress alert and oriented x3 mood and affect normal, dressed appropriately.  HEENT: Pupils equal, extraocular movements intact  Respiratory: Patient's speak in full sentences and does not appear short of breath  Cardiovascular: Barnes lower extremity edema, non tender, Barnes erythema  Skin: Warm dry intact with Barnes signs of infection or rash on extremities or on axial skeleton.  Abdomen: Soft nontender  Neuro: Cranial nerves II through XII are intact, neurovascularly intact in all extremities with 2+ DTRs and 2+ pulses.  Lymph: Barnes lymphadenopathy of posterior or anterior cervical chain or axillae bilaterally.  Gait normal with good balance and coordination.  MSK:  Non tender with full range of motion and good stability and symmetric strength and tone of shoulders, elbows, wrist, hip, knee bilaterally.  Left ankle shows the patient does have narrow feet.  Patient does have tender to palpation over the medial distal portion of the tibia.  Mild swelling may be been noted.  Barnes pain over the medial or lateral malleolus is full range of motion of the ankle.  Narrow foot noted Contralateral ankle unremarkable  Back exam mild loss of lordosis, tender to palpation in the paraspinal musculature lumbar spine regarding left.  Negative straight leg test, tightness with Pearlean Brownie test.  Mild pain in the thoracolumbar juncture as well  Osteopathic findings  C5 flexed rotated and side bent left T3 extended rotated and side bent right inhaled third rib T7 extended rotated and side bent left L2 flexed rotated and side bent right Sacrum right on right     Limited musculoskeletal ultrasound was performed and interpreted by Judi Saa  Limited ultrasound of patient's left ankle shows the patient does have may be a cortical irregularity of the distal tibia with some mild hypoechoic changes that is consistent with a stress reaction. Impression: Medial tibial stress syndrome      Impression and Recommendations:     This case required medical decision making of moderate complexity. The above documentation has been reviewed and is accurate and complete Judi Saa, DO       Note: This dictation was prepared with Dragon dictation along with smaller phrase technology. Any transcriptional errors that result from  this process are unintentional.

## 2018-12-24 NOTE — Assessment & Plan Note (Signed)
Decision today to treat with OMT was based on Physical Exam  After verbal consent patient was treated with HVLA, ME, FPR techniques in  thoracic, lumbar and sacral areas  Patient tolerated the procedure well with improvement in symptoms  Patient given exercises, stretches and lifestyle modifications  See medications in patient instructions if given  Patient will follow up in 4-8 weeks 

## 2018-12-24 NOTE — Assessment & Plan Note (Signed)
Medial tibial stress syndrome.  Discussed which activities to do which will still avoid.  Increase activity slowly.  We discussed proper shoes, over-the-counter orthotics, and recovery sandals.  Follow-up again in 4 to 8 weeks

## 2018-12-24 NOTE — Assessment & Plan Note (Signed)
Discussed posture and ergonomics.  Responded well to manipulation, discussed which activities to do which wants to avoid.  Follow-up again in 4 to 8 weeks

## 2018-12-24 NOTE — Patient Instructions (Addendum)
Good to see you.  Ice 20 minutes 2 times daily. Usually after activity and before bed. Exercises 3 times a week.  Compression socks  Once weekly vitamin D for 12 weeks.  Newtons shoes [place] or OOFOS sandals See me again in 6 weeks

## 2018-12-25 ENCOUNTER — Ambulatory Visit (INDEPENDENT_AMBULATORY_CARE_PROVIDER_SITE_OTHER): Payer: BC Managed Care – PPO | Admitting: Internal Medicine

## 2018-12-25 ENCOUNTER — Encounter: Payer: Self-pay | Admitting: Internal Medicine

## 2018-12-25 ENCOUNTER — Other Ambulatory Visit: Payer: Self-pay

## 2018-12-25 VITALS — BP 110/70 | HR 64 | Temp 98.4°F | Ht 67.5 in | Wt 159.0 lb

## 2018-12-25 DIAGNOSIS — Z23 Encounter for immunization: Secondary | ICD-10-CM

## 2018-12-25 DIAGNOSIS — F411 Generalized anxiety disorder: Secondary | ICD-10-CM | POA: Diagnosis not present

## 2018-12-25 DIAGNOSIS — N6320 Unspecified lump in the left breast, unspecified quadrant: Secondary | ICD-10-CM | POA: Diagnosis not present

## 2018-12-25 NOTE — Progress Notes (Signed)
   Subjective:   Patient ID: Pamela Barnes, female    DOB: 1985/07/08, 33 y.o.   MRN: [id number]  HPI The patient is a 33 YO female coming in for concerns about left breast lump. Felt this Tuesday night and it is sore to touch initially. Feels like it has shrunk in size slightly since showing up. No past history of similar. Is on birth control pills and denies missing doses. Denies nipple discharge or skin changes. Has had increase in caffeine in the last several weeks.   Review of Systems  Constitutional: Negative.   HENT: Negative.   Eyes: Negative.   Respiratory: Negative for cough, chest tightness and shortness of breath.   Cardiovascular: Negative for chest pain, palpitations and leg swelling.  Gastrointestinal: Negative for abdominal distention, abdominal pain, constipation, diarrhea, nausea and vomiting.  Genitourinary:       Lump left breast  Musculoskeletal: Negative.   Skin: Negative.   Neurological: Negative.   Psychiatric/Behavioral: Negative.     Objective:  Physical Exam Constitutional:      Appearance: She is well-developed.  HENT:     Head: Normocephalic and atraumatic.  Neck:     Musculoskeletal: Normal range of motion.  Cardiovascular:     Rate and Rhythm: Normal rate and regular rhythm.     Comments: Right breast normal, left breast with likely fibroadenoma 12 o clock movable and minimally tender Pulmonary:     Effort: Pulmonary effort is normal. No respiratory distress.     Breath sounds: Normal breath sounds. No wheezing or rales.  Abdominal:     General: Bowel sounds are normal. There is no distension.     Palpations: Abdomen is soft.     Tenderness: There is no abdominal tenderness. There is no rebound.  Skin:    General: Skin is warm and dry.  Neurological:     Mental Status: She is alert and oriented to person, place, and time.     Coordination: Coordination normal.     Vitals:   12/25/18 1325  BP: 110/70  Pulse: 64  Temp: 98.4 F (36.9 C)   TempSrc: Oral  SpO2: 97%  Weight: 159 lb (72.1 kg)  Height: 5' 7.5" (1.715 m)    Assessment & Plan:  Flu and Tdap given at visit

## 2018-12-25 NOTE — Assessment & Plan Note (Signed)
Getting [place] to confirm but likely fibroadenoma and discussed caffeine might have contributed and benign.

## 2018-12-25 NOTE — Patient Instructions (Signed)
We will check an ultrasound to check out the spot but this is likely a fibroadenoma which is not cancerous and usually go away on their own.   Fibroadenoma A fibroadenoma is a lump (tumor) in the breast. The lump is benign. This means that it is not cancer. It may move under your skin when you touch it. This kind of lump can grow in one breast or in both breasts. The cause of this condition is not known. Some of the lumps are too small to be felt. Others can be felt as firm, round, smooth, or movable lumps. This kind of lump can be treated with regular breast exams. Exams are done to check for changes in the tumor. In some cases, the tumor is removed. The tumor can be removed if:  It is large.  It continues to grow.  It is causing pain or changes in the skin of the breast.  The patient is an adolescent girl. Lumps in [place] girls tend to grow over time. Follow these instructions at home: Breast exams   Check your breasts at home as told by your doctor. Report any changes or concerns. Check for the following: ? The size of the tumor. ? The look and feel of the skin of your breasts. ? The look and feel of your nipples. General instructions  If you had a lump removed, follow instructions from your doctor for home care after surgery.  Do not use any products that contain nicotine or tobacco, such as cigarettes and e-cigarettes. These can further increase your cancer risk. If you need help quitting, ask your doctor.  Keep all follow-up visits as told by your doctor. This is important. You will need breast exams on a regular basis. Contact a doctor if:  The lump changes in size or feels different.  The lump starts to be painful.  You find a new lump.  You have any changes in the skin that covers your breast.  You have any changes in your nipple.  You have fluid leaking from your nipple.  You have redness around your nipple. Summary  A fibroadenoma is a lump (tumor) in the  breast. The lump is benign. This means that it is not cancer.  This tumor may feel like a firm, round, smooth, and movable lump in your breast. Some fibroadenomas are too small to be felt.  Having this condition may slightly increase your risk for developing breast cancer in the future.  Do breast exams at home. Watch for changes in the size of the lump. Also, watch for changes in the look and feel of your nipples and the skin of your breast.  Contact your doctor if the lump grows bigger or starts to cause pain. Also, let your doctor know if there is a change in the way your nipples look and in the feel of the skin of your breast. This information is not intended to replace advice given to you by your health care provider. Make sure you discuss any questions you have with your health care provider. Document Released: 05/25/2008 Document Revised: 03/11/2017 Document Reviewed: 03/11/2017 Elsevier Patient Education  2020 [employer].

## 2018-12-26 ENCOUNTER — Encounter: Payer: Self-pay | Admitting: Internal Medicine

## 2018-12-26 NOTE — Progress Notes (Signed)
Abstracted and sent to scan  

## 2018-12-30 ENCOUNTER — Other Ambulatory Visit: Payer: Self-pay | Admitting: Internal Medicine

## 2018-12-30 DIAGNOSIS — N6320 Unspecified lump in the left breast, unspecified quadrant: Secondary | ICD-10-CM

## 2019-01-01 DIAGNOSIS — F411 Generalized anxiety disorder: Secondary | ICD-10-CM | POA: Diagnosis not present

## 2019-01-06 DIAGNOSIS — Z6824 Body mass index (BMI) 24.0-24.9, adult: Secondary | ICD-10-CM | POA: Diagnosis not present

## 2019-01-06 DIAGNOSIS — Z01419 Encounter for gynecological examination (general) (routine) without abnormal findings: Secondary | ICD-10-CM | POA: Diagnosis not present

## 2019-01-09 DIAGNOSIS — F411 Generalized anxiety disorder: Secondary | ICD-10-CM | POA: Diagnosis not present

## 2019-01-11 ENCOUNTER — Encounter: Payer: Self-pay | Admitting: Internal Medicine

## 2019-01-15 ENCOUNTER — Ambulatory Visit
Admission: RE | Admit: 2019-01-15 | Discharge: 2019-01-15 | Disposition: A | Discharge status: Home / Self Care | Payer: BC Managed Care – PPO | Source: Ambulatory Visit | Attending: Internal Medicine | Admitting: Internal Medicine

## 2019-01-15 ENCOUNTER — Other Ambulatory Visit: Payer: Self-pay

## 2019-01-15 ENCOUNTER — Other Ambulatory Visit: Payer: Self-pay | Admitting: Internal Medicine

## 2019-01-15 DIAGNOSIS — N6321 Unspecified lump in the left breast, upper outer quadrant: Secondary | ICD-10-CM | POA: Diagnosis not present

## 2019-01-15 DIAGNOSIS — R922 Inconclusive mammogram: Secondary | ICD-10-CM | POA: Diagnosis not present

## 2019-01-15 DIAGNOSIS — N6002 Solitary cyst of left breast: Secondary | ICD-10-CM | POA: Diagnosis not present

## 2019-01-15 DIAGNOSIS — N6320 Unspecified lump in the left breast, unspecified quadrant: Secondary | ICD-10-CM

## 2019-01-16 ENCOUNTER — Ambulatory Visit
Admission: RE | Admit: 2019-01-16 | Discharge: 2019-01-16 | Disposition: A | Discharge status: Home / Self Care | Payer: BC Managed Care – PPO | Source: Ambulatory Visit | Attending: Internal Medicine | Admitting: Internal Medicine

## 2019-01-16 DIAGNOSIS — F411 Generalized anxiety disorder: Secondary | ICD-10-CM | POA: Diagnosis not present

## 2019-01-16 DIAGNOSIS — N6320 Unspecified lump in the left breast, unspecified quadrant: Secondary | ICD-10-CM

## 2019-01-16 DIAGNOSIS — N6002 Solitary cyst of left breast: Secondary | ICD-10-CM | POA: Diagnosis not present

## 2019-01-22 ENCOUNTER — Other Ambulatory Visit: Payer: BC Managed Care – PPO

## 2019-01-22 DIAGNOSIS — F411 Generalized anxiety disorder: Secondary | ICD-10-CM | POA: Diagnosis not present

## 2019-01-29 ENCOUNTER — Encounter: Payer: Self-pay | Admitting: Internal Medicine

## 2019-01-30 DIAGNOSIS — F411 Generalized anxiety disorder: Secondary | ICD-10-CM | POA: Diagnosis not present

## 2019-02-02 NOTE — Progress Notes (Signed)
[facility] [address] [place], [place] [address] Phone: [phone] Subjective:   Fontaine No, am serving as a scribe for Dr. Hulan Saas.  CC: Low back pain  LKG:[id number]   12/24/2018 Medial tibial stress syndrome.  Discussed which activities to do which will still avoid.  Increase activity slowly.  We discussed proper shoes, over-the-counter orthotics, and recovery sandals.  Follow-up again in 4 to 8 weeks  Discussed posture and ergonomics.  Responded well to manipulation, discussed which activities to do which wants to avoid.  Follow-up again in 4 to 8 weeks  Update 02/03/2019 Pamela Barnes is a 33 y.o. female coming in with complaint of left ankle pain and back pain. Patient states that she had a flare in her back last week. Pain is better this week.   Has been running and feeling better. Pain occurring at the medial and lateral malleolus with running.     Past Medical History:  Diagnosis Date  . Allergy   . Anxiety   . Syncope   . Vaso vagal episode    No past surgical history on file. Social History   Socioeconomic History  . Marital status: Married    Spouse name: Not on file  . Number of children: Not on file  . Years of education: Not on file  . Highest education level: Not on file  Occupational History  . Not on file  Social Needs  . Financial resource strain: Not on file  . Food insecurity    Worry: Not on file    Inability: Not on file  . Transportation needs    Medical: Not on file    Non-medical: Not on file  Tobacco Use  . Smoking status: Never Smoker  . Smokeless tobacco: Never Used  Substance and Sexual Activity  . Alcohol use: Yes    Alcohol/week: 4.0 standard drinks    Types: 2 Glasses of wine, 2 Cans of beer per week  . Drug use: No  . Sexual activity: Yes    Birth control/protection: Condom  Lifestyle  . Physical activity    Days per week: Not on file    Minutes per session: Not on file  .  Stress: Not on file  Relationships  . Social Herbalist on phone: Not on file    Gets together: Not on file    Attends religious service: Not on file    Active member of club or organization: Not on file    Attends meetings of clubs or organizations: Not on file    Relationship status: Not on file  Other Topics Concern  . Not on file  Social History Narrative  . Not on file   Allergies  Allergen Reactions  . Dust Mite Extract   . Amoxicillin Rash and Hives   Family History  Problem Relation Age of Onset  . Hyperlipidemia Mother   . Hypertension Mother   . Heart disease Father   . Cancer Maternal Grandfather        skin  . Heart disease Maternal Grandfather   . Heart disease Paternal Grandmother   . Cancer Paternal Grandfather        skin    Current Outpatient Medications (Endocrine & Metabolic):  .  desogestrel-ethinyl estradiol (APRI) 0.15-30 MG-MCG tablet, Take 1 tablet by mouth daily.    Current Outpatient Medications (Analgesics):  .  ibuprofen (ADVIL,MOTRIN) 800 MG tablet, Take 800 mg by mouth daily as needed.  Current Outpatient Medications (Other):  .  busPIRone (BUSPAR) 5 MG tablet, TAKE 1 TABLET TWICE A DAY AS NEEDED FOR ANXIETY .  Diclofenac Sodium 2 % SOLN, Place 2 g onto the skin 2 (two) times daily. Marland Kitchen  gabapentin (NEURONTIN) 100 MG capsule, TAKE 1 CAPSULE BY MOUTH THREE TIMES A DAY .  Vitamin D, Ergocalciferol, (DRISDOL) 1.25 MG (50000 UT) CAPS capsule, Take 1 capsule (50,000 Units total) by mouth every 7 (seven) days.    Past medical history, social, surgical and family history all reviewed in electronic medical record.  No pertanent information unless stated regarding to the chief complaint.   Review of Systems:  No headache, visual changes, nausea, vomiting, diarrhea, constipation, dizziness, abdominal pain, skin rash, fevers, chills, night sweats, weight loss, swollen lymph nodes, body aches, joint swelling, muscle aches, chest pain,  shortness of breath, mood changes.   Objective  There were no vitals taken for this visit. Systems examined below as of    General: No apparent distress alert and oriented x3 mood and affect normal, dressed appropriately.  HEENT: Pupils equal, extraocular movements intact  Respiratory: Patient's speak in full sentences and does not appear short of breath  Cardiovascular: No lower extremity edema, non tender, no erythema  Skin: Warm dry intact with no signs of infection or rash on extremities or on axial skeleton.  Abdomen: Soft nontender  Neuro: Cranial nerves II through XII are intact, neurovascularly intact in all extremities with 2+ DTRs and 2+ pulses.  Lymph: No lymphadenopathy of posterior or anterior cervical chain or axillae bilaterally.  Gait normal with good balance and coordination.  MSK:  Non tender with full range of motion and good stability and symmetric strength and tone of shoulders, elbows, wrist, hip, knee and ankles bilaterally.  Back Exam:  Inspection: Loss of lordosis Motion: Flexion 45 deg, Extension 25 deg, Side Bending to 45 deg bilaterally,  Rotation to 45 deg bilaterally  SLR laying: Negative  XSLR laying: Negative  Palpable tenderness: Tender to palpation of paraspinal musculature lumbar spine right greater than left. FABER: Tightness bilaterally. Sensory change: Gross sensation intact to all lumbar and sacral dermatomes.  Reflexes: 2+ at both patellar tendons, 2+ at achilles tendons, Babinski's downgoing.  Strength at foot  Plantar-flexion: 5/5 Dorsi-flexion: 5/5 Eversion: 5/5 Inversion: 5/5  Leg strength  Quad: 5/5 Hamstring: 5/5 Hip flexor: 5/5 Hip abductors: 5/5  Gait unremarkable.  Osteopathic findings T3 E RS right  L2 flexed rotated and side bent right Sacrum right on right    Impression and Recommendations:     This case required medical decision making of moderate complexity. The above documentation has been reviewed and is accurate and  complete Judi Saa, DO       Note: This dictation was prepared with Dragon dictation along with smaller phrase technology. Any transcriptional errors that result from this process are unintentional.

## 2019-02-03 ENCOUNTER — Encounter: Payer: Self-pay | Admitting: Family Medicine

## 2019-02-03 ENCOUNTER — Ambulatory Visit: Payer: BC Managed Care – PPO | Admitting: Family Medicine

## 2019-02-03 ENCOUNTER — Other Ambulatory Visit: Payer: Self-pay

## 2019-02-03 VITALS — BP 110/66 | HR 101 | Ht 67.5 in | Wt 159.0 lb

## 2019-02-03 DIAGNOSIS — M533 Sacrococcygeal disorders, not elsewhere classified: Secondary | ICD-10-CM | POA: Diagnosis not present

## 2019-02-03 DIAGNOSIS — M999 Biomechanical lesion, unspecified: Secondary | ICD-10-CM

## 2019-02-03 NOTE — Assessment & Plan Note (Signed)
Continues to have some tightness.  Discussed the hip flexors on stretching after a lot of running.  We discussed proper shoes therapy will be more beneficial.  Patient will increase frequency but decrease length of runs initially.  Patient will increase pulmonary.  Discussed icing regimen and home exercises.  Follow-up again in 4 to 8 weeks.

## 2019-02-03 NOTE — Assessment & Plan Note (Signed)
Decision today to treat with OMT was based on Physical Exam  After verbal consent patient was treated with HVLA, ME, FPR techniques in  thoracic, lumbar and sacral areas  Patient tolerated the procedure well with improvement in symptoms  Patient given exercises, stretches and lifestyle modifications  See medications in patient instructions if given  Patient will follow up in 4-8 weeks 

## 2019-02-03 NOTE — Patient Instructions (Addendum)
[facility] Total Support [place] X See me in 6-8 weeks

## 2019-02-15 ENCOUNTER — Encounter: Payer: Self-pay | Admitting: Internal Medicine

## 2019-02-15 DIAGNOSIS — Z20822 Contact with and (suspected) exposure to covid-19: Secondary | ICD-10-CM

## 2019-02-16 ENCOUNTER — Other Ambulatory Visit: Payer: Self-pay

## 2019-02-16 DIAGNOSIS — Z20822 Contact with and (suspected) exposure to covid-19: Secondary | ICD-10-CM

## 2019-02-17 LAB — NOVEL CORONAVIRUS, NAA: SARS-CoV-2, NAA: NOT DETECTED

## 2019-02-20 DIAGNOSIS — F411 Generalized anxiety disorder: Secondary | ICD-10-CM | POA: Diagnosis not present

## 2019-02-27 DIAGNOSIS — F411 Generalized anxiety disorder: Secondary | ICD-10-CM | POA: Diagnosis not present

## 2019-03-17 ENCOUNTER — Ambulatory Visit (INDEPENDENT_AMBULATORY_CARE_PROVIDER_SITE_OTHER): Payer: BC Managed Care – PPO | Admitting: Family Medicine

## 2019-03-17 ENCOUNTER — Other Ambulatory Visit: Payer: Self-pay

## 2019-03-17 ENCOUNTER — Encounter: Payer: Self-pay | Admitting: Family Medicine

## 2019-03-17 VITALS — BP 118/82 | HR 66 | Ht 67.5 in | Wt 168.0 lb

## 2019-03-17 DIAGNOSIS — M899 Disorder of bone, unspecified: Secondary | ICD-10-CM | POA: Insufficient documentation

## 2019-03-17 DIAGNOSIS — M999 Biomechanical lesion, unspecified: Secondary | ICD-10-CM

## 2019-03-17 MED ORDER — TIZANIDINE HCL 4 MG PO CAPS: ORAL_CAPSULE | ORAL | 0 refills | Status: AC

## 2019-03-17 NOTE — Assessment & Plan Note (Signed)
Decision today to treat with OMT was based on Physical Exam  After verbal consent patient was treated with HVLA, ME, FPR techniques in cervical, thoracic, rib,  lumbar and sacral areas  Patient tolerated the procedure well with improvement in symptoms  Patient given exercises, stretches and lifestyle modifications  See medications in patient instructions if given  Patient will follow up in 4-8 weeks 

## 2019-03-17 NOTE — Assessment & Plan Note (Signed)
Scapular dysfunction secondary to patient's muscle imbalances.  We discussed posture and ergonomics, which activities to do which wants to avoid.  Discussed icing regimen, home exercise, which activities to do which wants to avoid.  Patient responded well to manipulation but if continuing to have trouble will consider the possibility of trigger point injections.

## 2019-03-17 NOTE — Patient Instructions (Signed)
See me again in 6 weeks 

## 2019-03-17 NOTE — Progress Notes (Signed)
[name] [address] [place] [address] Phone: [phone] Subjective:   Bruce Donath, am serving as a scribe for Dr. [name]. This visit occurred during the SARS-CoV-2 public health emergency.  Safety protocols were in place, including screening questions prior to the visit, additional usage of staff PPE, and extensive cleaning of exam room while observing appropriate contact time as indicated for disinfecting solutions.   I'm seeing this patient by the request  of:  Myrlene Broker, MD   CC: back pain follow-up  EYC:[id number]  Pamela Barnes is a 34 y.o. female coming in with complaint of back pain. Last seen on 02/03/2019 for OMT. Patient states that her shoulders are bothering her more than usual in the trap muscles. Has been trying to stretch as well. Feels that an increase in technology use is causing her pain.      Past Medical History:  Diagnosis Date  . Allergy   . Anxiety   . Syncope   . Vaso vagal episode    No past surgical history on file. Social History   Socioeconomic History  . Marital status: Married    Spouse name: Not on file  . Number of children: Not on file  . Years of education: Not on file  . Highest education level: Not on file  Occupational History  . Not on file  Tobacco Use  . Smoking status: Never Smoker  . Smokeless tobacco: Never Used  Substance and Sexual Activity  . Alcohol use: Yes    Alcohol/week: 4.0 standard drinks    Types: 2 Glasses of wine, 2 Cans of beer per week  . Drug use: No  . Sexual activity: Yes    Birth control/protection: Condom  Other Topics Concern  . Not on file  Social History Narrative  . Not on file   Social Determinants of Health   Financial Resource Strain:   . Difficulty of Paying Living Expenses: Not on file  Food Insecurity:   . Worried About Programme researcher, broadcasting/film/video in the Last Year: Not on file  . Ran Out of Food in the Last Year: Not on file   Transportation Needs:   . Lack of Transportation (Medical): Not on file  . Lack of Transportation (Non-Medical): Not on file  Physical Activity:   . Days of Exercise per Week: Not on file  . Minutes of Exercise per Session: Not on file  Stress:   . Feeling of Stress : Not on file  Social Connections:   . Frequency of Communication with Friends and Family: Not on file  . Frequency of Social Gatherings with Friends and Family: Not on file  . Attends Religious Services: Not on file  . Active Member of Clubs or Organizations: Not on file  . Attends Banker Meetings: Not on file  . Marital Status: Not on file   Allergies  Allergen Reactions  . Dust Mite Extract   . Amoxicillin Rash and Hives   Family History  Problem Relation Age of Onset  . Hyperlipidemia Mother   . Hypertension Mother   . Heart disease Father   . Cancer Maternal Grandfather        skin  . Heart disease Maternal Grandfather   . Heart disease Paternal Grandmother   . Cancer Paternal Grandfather        skin    Current Outpatient Medications (Endocrine & Metabolic):  .  desogestrel-ethinyl estradiol (APRI) 0.15-30 MG-MCG tablet, Take  1 tablet by mouth daily.    Current Outpatient Medications (Analgesics):  .  ibuprofen (ADVIL,MOTRIN) 800 MG tablet, Take 800 mg by mouth daily as needed.   Current Outpatient Medications (Other):  .  busPIRone (BUSPAR) 5 MG tablet, TAKE 1 TABLET TWICE A DAY AS NEEDED FOR ANXIETY .  Diclofenac Sodium 2 % SOLN, Place 2 g onto the skin 2 (two) times daily. Marland Kitchen  gabapentin (NEURONTIN) 100 MG capsule, TAKE 1 CAPSULE BY MOUTH THREE TIMES A DAY .  Vitamin D, Ergocalciferol, (DRISDOL) 1.25 MG (50000 UT) CAPS capsule, Take 1 capsule (50,000 Units total) by mouth every 7 (seven) days. Marland Kitchen  tiZANidine (ZANAFLEX) 4 MG capsule, 1 tablet at night    Past medical history, social, surgical and family history all reviewed in electronic medical record.  No pertanent information  unless stated regarding to the chief complaint.   Review of Systems:  No  visual changes, nausea, vomiting, diarrhea, constipation, dizziness, abdominal pain, skin rash, fevers, chills, night sweats, weight loss, swollen lymph nodes, body aches, joint swelling, muscle aches, chest pain, shortness of breath, mood changes.  Positive headaches  Objective  Blood pressure 118/82, pulse 66, height 5' 7.5" (1.715 m), weight 168 lb (76.2 kg), SpO2 99 %.    General: No apparent distress alert and oriented x3 mood and affect normal, dressed appropriately.  HEENT: Pupils equal, extraocular movements intact  Respiratory: Patient's speak in full sentences and does not appear short of breath  Cardiovascular: No lower extremity edema, non tender, no erythema  Skin: Warm dry intact with no signs of infection or rash on extremities or on axial skeleton.  Abdomen: Soft nontender  Neuro: Cranial nerves II through XII are intact, neurovascularly intact in all extremities with 2+ DTRs and 2+ pulses.  Lymph: No lymphadenopathy of posterior or anterior cervical chain or axillae bilaterally.  Gait normal with good balance and coordination.  MSK:  tender with full range of motion and good stability and symmetric strength and tone of shoulders, elbows, wrist, hip, knee and ankles bilaterally.  Patient's neck does have some mild loss of lordosis.  Patient does have significant tightness in the parascapular region bilaterally with multiple trigger points in both rhomboids and trapezius muscles.  Patient shoulder so do have fairly full range of motion.  Diffusely tender in the paraspinal musculature of the cervical spine but negative Spurling's noted today.  Mild scapular dyskinesis especially the inferior scapula bilaterally   Osteopathic findings  C2 flexed rotated and side bent right C4 flexed rotated and side bent left C7 flexed rotated and side bent left T3 extended rotated and side bent right inhaled third  rib T6 extended rotated and side bent left L2 flexed rotated and side bent right Sacrum right on right    Impression and Recommendations:     This case required medical decision making of moderate complexity. The above documentation has been reviewed and is accurate and complete Lyndal Pulley, DO       Note: This dictation was prepared with Dragon dictation along with smaller phrase technology. Any transcriptional errors that result from this process are unintentional.

## 2019-04-10 ENCOUNTER — Encounter: Payer: Self-pay | Admitting: Family Medicine

## 2019-04-20 ENCOUNTER — Ambulatory Visit: Payer: BC Managed Care – PPO | Admitting: Family Medicine

## 2019-04-20 ENCOUNTER — Other Ambulatory Visit: Payer: Self-pay

## 2019-04-20 ENCOUNTER — Ambulatory Visit (INDEPENDENT_AMBULATORY_CARE_PROVIDER_SITE_OTHER): Payer: BC Managed Care – PPO

## 2019-04-20 ENCOUNTER — Encounter: Payer: Self-pay | Admitting: Family Medicine

## 2019-04-20 VITALS — BP 120/82 | HR 74 | Ht 67.0 in | Wt 152.0 lb

## 2019-04-20 DIAGNOSIS — M899 Disorder of bone, unspecified: Secondary | ICD-10-CM

## 2019-04-20 DIAGNOSIS — G8929 Other chronic pain: Secondary | ICD-10-CM

## 2019-04-20 DIAGNOSIS — M25571 Pain in right ankle and joints of right foot: Secondary | ICD-10-CM

## 2019-04-20 DIAGNOSIS — S86891A Other injury of other muscle(s) and tendon(s) at lower leg level, right leg, initial encounter: Secondary | ICD-10-CM | POA: Diagnosis not present

## 2019-04-20 DIAGNOSIS — M999 Biomechanical lesion, unspecified: Secondary | ICD-10-CM | POA: Diagnosis not present

## 2019-04-20 NOTE — Assessment & Plan Note (Signed)
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  Anticipatory guidance given

## 2019-04-20 NOTE — Assessment & Plan Note (Signed)
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  Increase activity slowly.  Discussed icing regimen and home exercises.  Patient should increase activity and follow-up with me again in 4 to 8 weeks

## 2019-04-20 NOTE — Patient Instructions (Signed)
Add K2 daily daily for one month Keep doing everything else See me again in 6-8 weeks

## 2019-04-20 NOTE — Progress Notes (Signed)
[name] [address] [place] [address] Phone: [phone] Subjective:   Bruce Donath, am serving as a scribe for Dr. [name].  This visit occurred during the SARS-CoV-2 public health emergency.  Safety protocols were in place, including screening questions prior to the visit, additional usage of staff PPE, and extensive cleaning of exam room while observing appropriate contact time as indicated for disinfecting solutions.    I'm seeing this patient by the request  of:  Myrlene Broker, MD  CC: backpain neck pain follow-up  [id number]  Pamela Barnes is a 34 y.o. female coming in with complaint of back pain. Last seen on 03/17/2019. Patient states that her back pain has worse recently. Left leg has been doing well but her right leg is bothering her at the ankle. Pain over extensor tendon with plantar flexion. Pain occurs around mile 1 of road running. Pain subsides once she stops running. Running in [employer] carbon x shoes and with insoles.      Past Medical History:  Diagnosis Date  . Allergy   . Anxiety   . Syncope   . Vaso vagal episode    No past surgical history on file. Social History   Socioeconomic History  . Marital status: Married    Spouse name: Not on file  . Number of children: Not on file  . Years of education: Not on file  . Highest education level: Not on file  Occupational History  . Not on file  Tobacco Use  . Smoking status: Never Smoker  . Smokeless tobacco: Never Used  Substance and Sexual Activity  . Alcohol use: Yes    Alcohol/week: 4.0 standard drinks    Types: 2 Glasses of wine, 2 Cans of beer per week  . Drug use: No  . Sexual activity: Yes    Birth control/protection: Condom  Other Topics Concern  . Not on file  Social History Narrative  . Not on file   Social Determinants of Health   Financial Resource Strain:   . Difficulty of Paying Living Expenses: Not on file  Food Insecurity:    . Worried About Programme researcher, broadcasting/film/video in the Last Year: Not on file  . Ran Out of Food in the Last Year: Not on file  Transportation Needs:   . Lack of Transportation (Medical): Not on file  . Lack of Transportation (Non-Medical): Not on file  Physical Activity:   . Days of Exercise per Week: Not on file  . Minutes of Exercise per Session: Not on file  Stress:   . Feeling of Stress : Not on file  Social Connections:   . Frequency of Communication with Friends and Family: Not on file  . Frequency of Social Gatherings with Friends and Family: Not on file  . Attends Religious Services: Not on file  . Active Member of Clubs or Organizations: Not on file  . Attends Banker Meetings: Not on file  . Marital Status: Not on file   Allergies  Allergen Reactions  . Dust Mite Extract   . Amoxicillin Rash and Hives   Family History  Problem Relation Age of Onset  . Hyperlipidemia Mother   . Hypertension Mother   . Heart disease Father   . Cancer Maternal Grandfather        skin  . Heart disease Maternal Grandfather   . Heart disease Paternal Grandmother   . Cancer Paternal Grandfather  skin    Current Outpatient Medications (Endocrine & Metabolic):  .  desogestrel-ethinyl estradiol (APRI) 0.15-30 MG-MCG tablet, Take 1 tablet by mouth daily.    Current Outpatient Medications (Analgesics):  .  ibuprofen (ADVIL,MOTRIN) 800 MG tablet, Take 800 mg by mouth daily as needed.   Current Outpatient Medications (Other):  .  busPIRone (BUSPAR) 5 MG tablet, TAKE 1 TABLET TWICE A DAY AS NEEDED FOR ANXIETY .  Diclofenac Sodium 2 % SOLN, Place 2 g onto the skin 2 (two) times daily. Marland Kitchen  gabapentin (NEURONTIN) 100 MG capsule, TAKE 1 CAPSULE BY MOUTH THREE TIMES A DAY .  tiZANidine (ZANAFLEX) 4 MG capsule, 1 tablet at night .  Vitamin D, Ergocalciferol, (DRISDOL) 1.25 MG (50000 UT) CAPS capsule, Take 1 capsule (50,000 Units total) by mouth every 7 (seven) days.   Reviewed prior  external information including notes and imaging from  primary care provider As well as notes that were available from care everywhere and other healthcare systems.  Past medical history, social, surgical and family history all reviewed in electronic medical record.  No pertanent information unless stated regarding to the chief complaint.   Review of Systems:  No headache, visual changes, nausea, vomiting, diarrhea, constipation, dizziness, abdominal pain, skin rash, fevers, chills, night sweats, weight loss, swollen lymph nodes, body aches, joint swelling, chest pain, shortness of breath, mood changes. POSITIVE muscle aches  Objective  There were no vitals taken for this visit.   General: No apparent distress alert and oriented x3 mood and affect normal, dressed appropriately.  HEENT: Pupils equal, extraocular movements intact  Respiratory: Patient's speak in full sentences and does not appear short of breath  Cardiovascular: No lower extremity edema, non tender, no erythema  Skin: Warm dry intact with no signs of infection or rash on extremities or on axial skeleton.  Abdomen: Soft nontender  Neuro: Cranial nerves II through XII are intact, neurovascularly intact in all extremities with 2+ DTRs and 2+ pulses.  Lymph: No lymphadenopathy of posterior or anterior cervical chain or axillae bilaterally.  Gait normal with good balance and coordination.  MSK:  Non tender with full range of motion and good stability and symmetric strength and tone of shoulders, elbows, wrist, hip, knee bilaterally.   Right ankle does show the patient does have some tenderness to palpation more over the retinaculum just proximal to the joint space on the anterior lateral aspect of the ankle.  Otherwise fairly unremarkable.    Back Exam:  Inspection: Mild loss of lordosis Motion: Flexion 45 deg, Extension 45 deg, Side Bending to 45 deg bilaterally,  Rotation to 45 deg bilaterally  SLR laying: Negative  XSLR  laying: Negative  Palpable tenderness: Mild tenderness in the paraspinal musculature the right side greater than left in the lumbar spine.  Improvement in core strength noted. FABER: negative. Sensory change: Gross sensation intact to all lumbar and sacral dermatomes.  Reflexes: 2+ at both patellar tendons, 2+ at achilles tendons, Babinski's downgoing.  Strength at foot  Plantar-flexion: 5/5 Dorsi-flexion: 5/5 Eversion: 5/5 Inversion: 5/5  Leg strength  Quad: 5/5 Hamstring: 5/5 Hip flexor: 5/5 Hip abductors: 5/5  Gait unremarkable.  Neck: Inspection mild loss of lordosis. No palpable stepoffs. Negative Spurling's maneuver. Full neck range of motion Grip strength and sensation normal in bilateral hands Strength good C4 to T1 distribution No sensory change to C4 to T1 Negative Hoffman sign bilaterally Reflexes normal Mild tightness right side of the paraspinal musculature in the scapular region  Osteopathic findings  C2 flexed rotated and side bent right C4 flexed rotated and side bent left C6 flexed rotated and side bent left T3 extended rotated and side bent right inhaled third rib T9 extended rotated and side bent left L2 flexed rotated and side bent right Sacrum right on right  Limited musculoskeletal ultrasound was performed and interpreted by Lyndal Pulley  Limited ultrasound of patient's right ankle shows that on the lateral aspect of the tibia it does appear to have more of a cortical irregularity that is consistent with stress reaction.  No acute changes otherwise.  Mild callus formation noted in the area with increasing Doppler flow Impression: Tibial stress syndrome   Impression and Recommendations:     This case required medical decision making of moderate complexity. The above documentation has been reviewed and is accurate and complete Lyndal Pulley, DO       Note: This dictation was prepared with Dragon dictation along with smaller phrase technology. Any  transcriptional errors that result from this process are unintentional.

## 2019-04-20 NOTE — Assessment & Plan Note (Signed)
Contralateral side, discussed icing regimen and home exercise, discussed which activities to do which wants to avoid.  Increase activity as tolerated.  Follow-up again in 4 to 8 weeks.

## 2019-04-28 ENCOUNTER — Ambulatory Visit: Payer: BC Managed Care – PPO | Admitting: Family Medicine

## 2019-06-16 ENCOUNTER — Ambulatory Visit: Payer: Self-pay

## 2019-06-16 ENCOUNTER — Encounter: Payer: Self-pay | Admitting: Family Medicine

## 2019-06-16 ENCOUNTER — Ambulatory Visit: Payer: BC Managed Care – PPO | Admitting: Family Medicine

## 2019-06-16 ENCOUNTER — Other Ambulatory Visit: Payer: Self-pay

## 2019-06-16 VITALS — BP 100/60 | HR 60 | Ht 67.0 in | Wt 154.0 lb

## 2019-06-16 DIAGNOSIS — S93491A Sprain of other ligament of right ankle, initial encounter: Secondary | ICD-10-CM

## 2019-06-16 DIAGNOSIS — M899 Disorder of bone, unspecified: Secondary | ICD-10-CM | POA: Diagnosis not present

## 2019-06-16 DIAGNOSIS — M999 Biomechanical lesion, unspecified: Secondary | ICD-10-CM

## 2019-06-16 DIAGNOSIS — G8929 Other chronic pain: Secondary | ICD-10-CM

## 2019-06-16 DIAGNOSIS — M25571 Pain in right ankle and joints of right foot: Secondary | ICD-10-CM | POA: Diagnosis not present

## 2019-06-16 DIAGNOSIS — S93401A Sprain of unspecified ligament of right ankle, initial encounter: Secondary | ICD-10-CM | POA: Insufficient documentation

## 2019-06-16 NOTE — Assessment & Plan Note (Signed)
Exacerbation secondary to hiking with a backpack.  Encourage patient to take the muscle relaxer, amantadine, gabapentin as needed and may be on a more regular basis.  Discussed home exercises, ergonomics throughout the day.  Follow-up again 6 to 8 weeks

## 2019-06-16 NOTE — Patient Instructions (Addendum)
Ice after activity Exercise 3 times a week See me again in 6-8 weeks

## 2019-06-16 NOTE — Progress Notes (Signed)
Pamela Barnes [address] [place] Phone: [phone] Subjective:   I Pamela Barnes am serving as a Education administrator for Dr. Hulan Barnes.  This visit occurred during the SARS-CoV-2 public health emergency.  Safety protocols were in place, including screening questions prior to the visit, additional usage of staff PPE, and extensive cleaning of exam room while observing appropriate contact time as indicated for disinfecting solutions.   I'm seeing this patient by the request  of:  Pamela Koch, MD  CC: neck and low back pain follow-up with new ankle injury  [id number]  Pamela Barnes is a 34 y.o. female coming in with complaint of neck pain. Last seen 04/20/2019 for OMT. Patient states her stress reaction has gone away. Rolled right ankle about a month ago. States she was running when she rolled it. Continued running for another 2.5 miles after. Took a week off. Went on a back packing trip this past weekend. Saturday she walked 10 miles with lots of elevation change. States the ankle was not supported. Just finished K2 and believes it helped with her stress reactions. Pamela Barnes hiking Location - lateral Duration-  Character- "stretching"  Aggravating factors- inversion  Reliving factors-decreasing activity Therapies tried- ice, compression socks  Severity-4 out of 10     Past Medical History:  Diagnosis Date  . Allergy   . Anxiety   . Syncope   . Vaso vagal episode    No past surgical history on file. Social History   Socioeconomic History  . Marital status: Married    Spouse name: Not on file  . Number of children: Not on file  . Years of education: Not on file  . Highest education level: Not on file  Occupational History  . Not on file  Tobacco Use  . Smoking status: Never Smoker  . Smokeless tobacco: Never Used  Substance and Sexual Activity  . Alcohol use: Yes    Alcohol/week: 4.0 standard drinks    Types: 2  Glasses of wine, 2 Cans of beer per week  . Drug use: No  . Sexual activity: Yes    Birth control/protection: Condom  Other Topics Concern  . Not on file  Social History Narrative  . Not on file   Social Determinants of Health   Financial Resource Strain:   . Difficulty of Paying Living Expenses:   Food Insecurity:   . Worried About Charity fundraiser in the Last Year:   . Pamela Barnes in the Last Year:   Transportation Needs:   . Film/video editor (Medical):   Pamela Barnes Kitchen Lack of Transportation (Non-Medical):   Physical Activity:   . Days of Exercise per Week:   . Minutes of Exercise per Session:   Stress:   . Feeling of Stress :   Social Connections:   . Frequency of Communication with Friends and Family:   . Frequency of Social Gatherings with Friends and Family:   . Attends Religious Services:   . Active Member of Clubs or Organizations:   . Attends Archivist Meetings:   Pamela Barnes Kitchen Marital Status:    Allergies  Allergen Reactions  . Dust Mite Extract   . Amoxicillin Rash and Hives   Family History  Problem Relation Age of Onset  . Hyperlipidemia Mother   . Hypertension Mother   . Heart disease Father   . Cancer Maternal Grandfather        skin  . Heart  disease Maternal Grandfather   . Heart disease Paternal Grandmother   . Cancer Paternal Grandfather        skin    Current Outpatient Medications (Endocrine & Metabolic):  .  desogestrel-ethinyl estradiol (APRI) 0.15-30 MG-MCG tablet, Take 1 tablet by mouth daily.    Current Outpatient Medications (Analgesics):  .  ibuprofen (ADVIL,MOTRIN) 800 MG tablet, Take 800 mg by mouth daily as needed.   Current Outpatient Medications (Other):  .  busPIRone (BUSPAR) 5 MG tablet, TAKE 1 TABLET TWICE A DAY AS NEEDED FOR ANXIETY .  Diclofenac Sodium 2 % SOLN, Place 2 g onto the skin 2 (two) times daily. Pamela Barnes Kitchen  gabapentin (NEURONTIN) 100 MG capsule, TAKE 1 CAPSULE BY MOUTH THREE TIMES A DAY .  tiZANidine (ZANAFLEX) 4 MG  capsule, 1 tablet at night .  Vitamin D, Ergocalciferol, (DRISDOL) 1.25 MG (50000 UT) CAPS capsule, Take 1 capsule (50,000 Units total) by mouth every 7 (seven) days.   Reviewed prior external information including notes and imaging from  primary care provider As well as notes that were available from care everywhere and other healthcare systems.  Past medical history, social, surgical and family history all reviewed in electronic medical record.  No pertanent information unless stated regarding to the chief complaint.   Review of Systems:  No headache, visual changes, nausea, vomiting, diarrhea, constipation, dizziness, abdominal pain, skin rash, fevers, chills, night sweats, weight loss, swollen lymph nodes, body aches, joint swelling, chest pain, shortness of breath, mood changes. POSITIVE muscle aches  Objective  Blood pressure 100/60, pulse 60, height 5\' 7"  (1.702 m), weight 154 lb (69.9 kg), SpO2 97 %.   General: No apparent distress alert and oriented x3 mood and affect normal, dressed appropriately.  HEENT: Pupils equal, extraocular movements intact  Respiratory: Patient's speak in full sentences and does not appear short of breath  Cardiovascular: No lower extremity edema, non tender, no erythema  Neuro: Cranial nerves II through XII are intact, neurovascularly intact in all extremities with 2+ DTRs and 2+ pulses.  Gait normal with good balance and coordination.  MSK:  Non tender with full range of motion and good stability and symmetric strength and tone of shoulders, elbows, wrist, hip, knee and bilaterally.  Patient is doing relatively well overall.  Patient's right ankle though does have some tenderness to palpation in the ATFL region.  Negative anterior drawer though.  Neurovascularly intact distally.  Neck exam does have some tightness, mild loss of lordosis.  Tender to palpation in the parascapular region.  Negative Spurling's.  Mild tightness around the right sacroiliac  joint noted today as well.  Limited musculoskeletal ultrasound was performed and interpreted by  Limited ultrasound shows the patient does have some hypoechoic changes around the ATFL but the ATFL does appear to be intact.  Patient does not have any signs of any avulsion noted. Impression: Ankle sprain  Osteopathic findings  C6 flexed rotated and side bent left T3 extended rotated and side bent right inhaled third rib T11 extended rotated and side bent left L2 flexed rotated and side bent right Sacrum right on right    Impression and Recommendations:     This case required medical decision making of moderate complexity. The above documentation has been reviewed and is accurate and complete Judi Saa, DO       Note: This dictation was prepared with Dragon dictation along with smaller phrase technology. Any transcriptional errors that result from this process are unintentional.

## 2019-06-16 NOTE — Assessment & Plan Note (Signed)
Right ankle sprain, grade 1.  Mild hypoechoic changes on the ultrasound discussed icing regimen and home exercises.  Discussed potential compression.  Topical anti-inflammatories, follow-up again in 4 to 6 weeks

## 2019-06-16 NOTE — Assessment & Plan Note (Signed)

## 2019-06-21 ENCOUNTER — Other Ambulatory Visit: Payer: Self-pay | Admitting: Internal Medicine

## 2019-08-05 ENCOUNTER — Ambulatory Visit: Payer: BC Managed Care – PPO | Admitting: Family Medicine

## 2019-08-05 ENCOUNTER — Encounter: Payer: Self-pay | Admitting: Family Medicine

## 2019-08-05 ENCOUNTER — Other Ambulatory Visit: Payer: Self-pay

## 2019-08-05 VITALS — BP 108/72 | HR 77 | Ht 67.0 in | Wt 154.0 lb

## 2019-08-05 DIAGNOSIS — M899 Disorder of bone, unspecified: Secondary | ICD-10-CM

## 2019-08-05 DIAGNOSIS — M999 Biomechanical lesion, unspecified: Secondary | ICD-10-CM

## 2019-08-05 NOTE — Assessment & Plan Note (Signed)
Chronic problem with still some mild exacerbation.  Taking gabapentin intermittently.  Patient seems to be doing well and has Zanaflex for breakthrough.  Patient does have some mild tightness but is responding extremely well to osteopathic manipulation.  Follow-up again in 4 to 8 weeks

## 2019-08-05 NOTE — Patient Instructions (Signed)
Much better overall See me in 2-3 months

## 2019-08-05 NOTE — Progress Notes (Signed)
[place] Phone: [phone] Subjective:   Fontaine No, am serving as a scribe for Dr. Hulan Saas. This visit occurred during the SARS-CoV-2 public health emergency.  Safety protocols were in place, including screening questions prior to the visit, additional usage of staff PPE, and extensive cleaning of exam room while observing appropriate contact time as indicated for disinfecting solutions.   I'm seeing this patient by the request  of:  Hoyt Koch, MD  CC: Upper back pain follow-up, ankle pain follow-up  [id number]   06/16/2019 Right ankle sprain, grade 1.  Mild hypoechoic changes on the ultrasound discussed icing regimen and home exercises.  Discussed potential compression.  Topical anti-inflammatories, follow-up again in 4 to 6 weeks  Update 08/05/2019 Pamela Barnes is a 34 y.o. female coming in with complaint of right ankle sprain and back pain. Patient states that she has not had any pain in ankle since last visit.   No changes in back since last visit.  Patient states that doing relatively well overall.  Patient has been very active.  Running 4 days a week and doing yoga 4 days a week.     Past Medical History:  Diagnosis Date  . Allergy   . Anxiety   . Syncope   . Vaso vagal episode    No past surgical history on file. Social History   Socioeconomic History  . Marital status: Married    Spouse name: Not on file  . Number of children: Not on file  . Years of education: Not on file  . Highest education level: Not on file  Occupational History  . Not on file  Tobacco Use  . Smoking status: Never Smoker  . Smokeless tobacco: Never Used  Substance and Sexual Activity  . Alcohol use: Yes    Alcohol/week: 4.0 standard drinks    Types: 2 Glasses of wine, 2 Cans of beer per week  . Drug use: No  . Sexual activity: Yes    Birth control/protection: Condom  Other Topics Concern    . Not on file  Social History Narrative  . Not on file   Social Determinants of Health   Financial Resource Strain:   . Difficulty of Paying Living Expenses:   Food Insecurity:   . Worried About Charity fundraiser in the Last Year:   . Arboriculturist in the Last Year:   Transportation Needs:   . Film/video editor (Medical):   Marland Kitchen Lack of Transportation (Non-Medical):   Physical Activity:   . Days of Exercise per Week:   . Minutes of Exercise per Session:   Stress:   . Feeling of Stress :   Social Connections:   . Frequency of Communication with Friends and Family:   . Frequency of Social Gatherings with Friends and Family:   . Attends Religious Services:   . Active Member of Clubs or Organizations:   . Attends Archivist Meetings:   Marland Kitchen Marital Status:    Allergies  Allergen Reactions  . Dust Mite Extract   . Amoxicillin Rash and Hives   Family History  Problem Relation Age of Onset  . Hyperlipidemia Mother   . Hypertension Mother   . Heart disease Father   . Cancer Maternal Grandfather        skin  . Heart disease Maternal Grandfather   . Heart disease Paternal Grandmother   . Cancer Paternal Grandfather  skin    Current Outpatient Medications (Endocrine & Metabolic):  .  desogestrel-ethinyl estradiol (APRI) 0.15-30 MG-MCG tablet, Take 1 tablet by mouth daily.    Current Outpatient Medications (Analgesics):  .  ibuprofen (ADVIL,MOTRIN) 800 MG tablet, Take 800 mg by mouth daily as needed.   Current Outpatient Medications (Other):  .  busPIRone (BUSPAR) 5 MG tablet, TAKE 1 TABLET BY MOUTH TWICE A DAY AS NEEDED FOR ANXIETY .  Diclofenac Sodium 2 % SOLN, Place 2 g onto the skin 2 (two) times daily. Marland Kitchen  gabapentin (NEURONTIN) 100 MG capsule, TAKE 1 CAPSULE BY MOUTH THREE TIMES A DAY .  tiZANidine (ZANAFLEX) 4 MG capsule, 1 tablet at night .  Vitamin D, Ergocalciferol, (DRISDOL) 1.25 MG (50000 UT) CAPS capsule, Take 1 capsule (50,000 Units  total) by mouth every 7 (seven) days.   Reviewed prior external information including notes and imaging from  primary care provider As well as notes that were available from care everywhere and other healthcare systems.  Past medical history, social, surgical and family history all reviewed in electronic medical record.  No pertanent information unless stated regarding to the chief complaint.   Review of Systems:  No headache, visual changes, nausea, vomiting, diarrhea, constipation, dizziness, abdominal pain, skin rash, fevers, chills, night sweats, weight loss, swollen lymph nodes, body aches, joint swelling, chest pain, shortness of breath, mood changes. POSITIVE muscle aches  Objective  Blood pressure 108/72, pulse 77, height 5\' 7"  (1.702 m), weight 154 lb (69.9 kg), SpO2 98 %.   General: No apparent distress alert and oriented x3 mood and affect normal, dressed appropriately.  HEENT: Pupils equal, extraocular movements intact  Respiratory: Patient's speak in full sentences and does not appear short of breath  Cardiovascular: No lower extremity edema, non tender, no erythema  Neuro: Cranial nerves II through XII are intact, neurovascularly intact in all extremities with 2+ DTRs and 2+ pulses.  Gait normal with good balance and coordination.  MSK:  Non tender with full range of motion and good stability and symmetric strength and tone of shoulders, elbows, wrist, hip, knee bilaterally.     Right ankle has full range of motion and full strength.  Nontender on exam  Back exam mild tenderness to palpation in the right scapular region.  Patient does have some tightness in the area and does have some mild decreased range of motion in all planes.  Negative straight leg test.  Mild positive FABER test right greater than left.  Osteopathic findings  C2 flexed rotated and side bent right C7 flexed rotated and side bent left T3 extended rotated and side bent right inhaled third rib T6  extended rotated and side bent left L2 flexed rotated and side bent right Sacrum right on right  Impression and Recommendations:     This case required medical decision making of moderate complexity. The above documentation has been reviewed and is accurate and complete , DO       Note: This dictation was prepared with Dragon dictation along with smaller phrase technology. Any transcriptional errors that result from this process are unintentional.

## 2019-08-05 NOTE — Assessment & Plan Note (Signed)

## 2019-09-08 ENCOUNTER — Encounter: Payer: Self-pay | Admitting: Internal Medicine

## 2019-09-09 MED ORDER — NORGESTIMATE-ETH ESTRADIOL 0.25-35 MG-MCG PO TABS: 1.0000 | ORAL_TABLET | Freq: Every day | ORAL | 11 refills | Status: AC

## 2019-10-16 ENCOUNTER — Encounter: Payer: Self-pay | Admitting: Family Medicine

## 2019-10-16 ENCOUNTER — Ambulatory Visit (INDEPENDENT_AMBULATORY_CARE_PROVIDER_SITE_OTHER): Payer: No Typology Code available for payment source | Admitting: Family Medicine

## 2019-10-16 ENCOUNTER — Other Ambulatory Visit: Payer: Self-pay

## 2019-10-16 VITALS — BP 102/72 | HR 84 | Ht 67.0 in | Wt 155.0 lb

## 2019-10-16 DIAGNOSIS — M999 Biomechanical lesion, unspecified: Secondary | ICD-10-CM

## 2019-10-16 DIAGNOSIS — M533 Sacrococcygeal disorders, not elsewhere classified: Secondary | ICD-10-CM

## 2019-10-16 NOTE — Progress Notes (Signed)
Pamela Barnes [address] [place] [address] Phone: [phone] Subjective:   Bruce Donath, am serving as a scribe for Dr. [name]. This visit occurred during the SARS-CoV-2 public health emergency.  Safety protocols were in place, including screening questions prior to the visit, additional usage of staff PPE, and extensive cleaning of exam room while observing appropriate contact time as indicated for disinfecting solutions.   I'm seeing this patient by the request  of:  Myrlene Broker, MD  CC: Low back pain  [id number]  Pamela Barnes is a 34 y.o. female coming in with complaint of back and neck pain. OMT 08/05/2019. Patient states that she has not had any issues since last visit with her back.   Sprained ankle, right.   Medications patient has been prescribed: Gabapentin intermittently  Taking: Very intermittent         Reviewed prior external information including notes and imaging from previsou exam, outside providers and external EMR if available.   As well as notes that were available from care everywhere and other healthcare systems.  Past medical history, social, surgical and family history all reviewed in electronic medical record.  No pertanent information unless stated regarding to the chief complaint.   Past Medical History:  Diagnosis Date  . Allergy   . Anxiety   . Syncope   . Vaso vagal episode     Allergies  Allergen Reactions  . Dust Mite Extract   . Amoxicillin Rash and Hives     Review of Systems:  No headache, visual changes, nausea, vomiting, diarrhea, constipation, dizziness, abdominal pain, skin rash, fevers, chills, night sweats, weight loss, swollen lymph nodes, body aches, joint swelling, chest pain, shortness of breath, mood changes. POSITIVE muscle aches  Objective  Blood pressure 102/72, pulse 84, height 5\' 7"  (1.702 m), weight 155 lb (70.3 kg), SpO2 98 %.   General: No apparent  distress alert and oriented x3 mood and affect normal, dressed appropriately.  HEENT: Pupils equal, extraocular movements intact  Respiratory: Patient's speak in full sentences and does not appear short of breath  Cardiovascular: No lower extremity edema, non tender, no erythema  Neuro: Cranial nerves II through XII are intact, neurovascularly intact in all extremities with 2+ DTRs and 2+ pulses.  Gait normal with good balance and coordination.  MSK:  Non tender with full range of motion and good stability and symmetric strength and tone of shoulders, elbows, wrist, hip, knee and ankles bilaterally.  Back -low back exam does have some mild loss of lordosis, some tenderness to palpation in the paraspinal musculature.  Positive Faber negative straight leg test.  Tightness of the left piriformis today  Osteopathic findings  C5 flexed rotated and side bent right T3 extended rotated and side bent right inhaled rib T9 extended rotated and side bent left L1 flexed rotated and side bent left Sacrum right on right       Assessment and Plan:    Nonallopathic problems  Decision today to treat with OMT was based on Physical Exam  After verbal consent patient was treated with HVLA, ME, FPR techniques in cervical, rib, thoracic, lumbar, and sacral  areas  Patient tolerated the procedure well with improvement in symptoms  Patient given exercises, stretches and lifestyle modifications  See medications in patient instructions if given  Patient will follow up in 4-8 weeks      The above documentation has been reviewed and is accurate and complete  Tamala Julian, DO       Note: This dictation was prepared with Dragon dictation along with smaller phrase technology. Any transcriptional errors that result from this process are unintentional.

## 2019-10-16 NOTE — Assessment & Plan Note (Signed)
More of the sacroiliac dysfunction with patient having actually more tightness of the lower extremities.  Patient is having tenderness to palpation and tightness of the hip flexors.  Overall being very active.  Has gabapentin if necessary but not wearing it more.  Follow-up again  8 weeks

## 2019-10-16 NOTE — Patient Instructions (Addendum)
Tight hip flexors Stand at desk if possible  See me again in 2-3 Months

## 2019-10-16 NOTE — Assessment & Plan Note (Signed)

## 2019-11-05 ENCOUNTER — Other Ambulatory Visit: Payer: Self-pay | Admitting: Family Medicine

## 2019-12-23 ENCOUNTER — Other Ambulatory Visit: Payer: Self-pay

## 2019-12-23 ENCOUNTER — Encounter: Payer: Self-pay | Admitting: Family Medicine

## 2019-12-23 ENCOUNTER — Ambulatory Visit (INDEPENDENT_AMBULATORY_CARE_PROVIDER_SITE_OTHER): Payer: No Typology Code available for payment source | Admitting: Family Medicine

## 2019-12-23 VITALS — BP 110/82 | HR 76 | Ht 67.0 in | Wt 147.0 lb

## 2019-12-23 DIAGNOSIS — M533 Sacrococcygeal disorders, not elsewhere classified: Secondary | ICD-10-CM | POA: Diagnosis not present

## 2019-12-23 DIAGNOSIS — M999 Biomechanical lesion, unspecified: Secondary | ICD-10-CM

## 2019-12-23 NOTE — Progress Notes (Signed)
[name] [address] [place] [address] Phone: [phone] Subjective:   Pamela Barnes, am serving as a scribe for Dr. [name]. This visit occurred during the SARS-CoV-2 public health emergency.  Safety protocols were in place, including screening questions prior to the visit, additional usage of staff PPE, and extensive cleaning of exam room while observing appropriate contact time as indicated for disinfecting solutions.   I'm seeing this patient by the request  of:  Myrlene Broker, MD  CC: Neck and back pain follow-up  [id number]  Pamela Barnes is a 34 y.o. female coming in with complaint of back and neck pain. OMT 10/16/2019. Patient states that she finished her 1st half marathon. Patient sprained right ankle during the race. Pain is improving. Has been back to running. Also complains of pain in left hip flexor and glute with a recent trail run.   Medications patient has been prescribed: Gabapentin, Zanaflex, Vit D  Taking:         Reviewed prior external information including notes and imaging from previsou exam, outside providers and external EMR if available.   As well as notes that were available from care everywhere and other healthcare systems.  Past medical history, social, surgical and family history all reviewed in electronic medical record.  No pertanent information unless stated regarding to the chief complaint.   Past Medical History:  Diagnosis Date  . Allergy   . Anxiety   . Syncope   . Vaso vagal episode     Allergies  Allergen Reactions  . Dust Mite Extract   . Amoxicillin Rash and Hives     Review of Systems:  No headache, visual changes, nausea, vomiting, diarrhea, constipation, dizziness, abdominal pain, skin rash, fevers, chills, night sweats, weight loss, swollen lymph nodes, body aches, joint swelling, chest pain, shortness of breath, mood changes. POSITIVE muscle aches  Objective    There were no vitals taken for this visit.   General: No apparent distress alert and oriented x3 mood and affect normal, dressed appropriately.  HEENT: Pupils equal, extraocular movements intact  Respiratory: Patient's speak in full sentences and does not appear short of breath  Cardiovascular: No lower extremity edema, non tender, no erythema  Neuro: Cranial nerves II through XII are intact, neurovascularly intact in all extremities with 2+ DTRs and 2+ pulses.  Gait normal with good balance and coordination.  MSK:  Non tender with full range of motion and good stability and symmetric strength and tone of shoulders, elbows, wrist, hip, knee and ankles bilaterally.  Back -continued increased tightness of the plexus bilaterally.  Tender to palpation over the L5-S1 area and right greater than left.  Patient does still have some mild weakness of the hip abductor right greater than left.  Osteopathic findings  C6 flexed rotated and side bent left T5 extended rotated and side bent left inhaled rib L2 flexed rotated and side bent right Sacrum right on right        Assessment and Plan:  SI (sacroiliac) joint dysfunction Mild increase in tightness of the hip flexors bilaterally.  Patient is going to do more stretches.  Patient work with Event organiser today to learn them in greater detail.  Patient is taking gabapentin very intermittently and seems to be doing well.  Increase activity slowly.  Follow-up with me again in 4 to 8 weeks   Nonallopathic problems  Decision today to treat with OMT was based on Physical Exam  After  verbal consent patient was treated with HVLA, ME, FPR techniques in cervical, rib, thoracic, lumbar, and sacral  areas  Patient tolerated the procedure well with improvement in symptoms  Patient given exercises, stretches and lifestyle modifications  See medications in patient instructions if given  Patient will follow up in 4-8 weeks      The above  documentation has been reviewed and is accurate and complete Judi Saa, DO       Note: This dictation was prepared with Dragon dictation along with smaller phrase technology. Any transcriptional errors that result from this process are unintentional.

## 2019-12-23 NOTE — Patient Instructions (Signed)
Overall better See me in 2-3 months

## 2019-12-23 NOTE — Assessment & Plan Note (Signed)
Mild increase in tightness of the hip flexors bilaterally.  Patient is going to do more stretches.  Patient work with Event organiser today to learn them in greater detail.  Patient is taking gabapentin very intermittently and seems to be doing well.  Increase activity slowly.  Follow-up with me again in 4 to 8 weeks

## 2020-01-09 ENCOUNTER — Other Ambulatory Visit: Payer: Self-pay | Admitting: Family Medicine

## 2020-03-14 ENCOUNTER — Encounter: Payer: Self-pay | Admitting: Internal Medicine

## 2020-03-22 ENCOUNTER — Encounter: Payer: Self-pay | Admitting: Family Medicine

## 2020-03-24 ENCOUNTER — Other Ambulatory Visit: Payer: Self-pay

## 2020-03-24 ENCOUNTER — Ambulatory Visit (INDEPENDENT_AMBULATORY_CARE_PROVIDER_SITE_OTHER): Payer: No Typology Code available for payment source | Admitting: Family Medicine

## 2020-03-24 ENCOUNTER — Ambulatory Visit: Payer: Self-pay

## 2020-03-24 ENCOUNTER — Encounter: Payer: Self-pay | Admitting: Family Medicine

## 2020-03-24 VITALS — BP 120/82 | HR 68 | Ht 67.0 in | Wt 147.0 lb

## 2020-03-24 DIAGNOSIS — M999 Biomechanical lesion, unspecified: Secondary | ICD-10-CM

## 2020-03-24 DIAGNOSIS — M79641 Pain in right hand: Secondary | ICD-10-CM

## 2020-03-24 DIAGNOSIS — S93402A Sprain of unspecified ligament of left ankle, initial encounter: Secondary | ICD-10-CM | POA: Insufficient documentation

## 2020-03-24 DIAGNOSIS — S93492A Sprain of other ligament of left ankle, initial encounter: Secondary | ICD-10-CM | POA: Diagnosis not present

## 2020-03-24 DIAGNOSIS — L720 Epidermal cyst: Secondary | ICD-10-CM | POA: Insufficient documentation

## 2020-03-24 DIAGNOSIS — M533 Sacrococcygeal disorders, not elsewhere classified: Secondary | ICD-10-CM

## 2020-03-24 NOTE — Assessment & Plan Note (Signed)
Seems to be more of a inclusion cyst.  This is superficial to the tendon.  Seems to be in the subcutaneous or epidermis areas.  Discussed with patient about different things to do.  Patient states that it is annoying.  We will leave it unless it starts giving more discomfort then consider the possibility of aspiration.  On ultrasound no abnormal blood flow.  Follow-up again in 2 months

## 2020-03-24 NOTE — Assessment & Plan Note (Signed)
Chronic problem, mild instability.  Discussed posture and ergonomics, discussed which activities to do which wants to avoid.  Has muscle relaxer for breakthrough pain.  Responding well to manipulation.

## 2020-03-24 NOTE — Patient Instructions (Addendum)
Good to see you Exercises for the ankle Ice after running  The finger looks like an inclusion cyst See me again in 2 months

## 2020-03-24 NOTE — Progress Notes (Signed)
[name] [address] [place] [address] Phone: [phone] Subjective:   I Pamela Barnes am serving as a Neurosurgeon for Dr. [name].  This visit occurred during the SARS-CoV-2 public health emergency.  Safety protocols were in place, including screening questions prior to the visit, additional usage of staff PPE, and extensive cleaning of exam room while observing appropriate contact time as indicated for disinfecting solutions.   I'm seeing this patient by the request  of:  Myrlene Broker, MD  CC: Left ankle pain, back pain follow-up and hand  pain  [id number]  Pamela Barnes is a 35 y.o. female coming in with complaint of back and neck pain. OMT 12/23/2019. Also complaining of cyst in hand. Patient states she has started cross training and it has been helping. States she believes she has a cyst in the right hand at the middle finger on the palm side of her hand. Rolled left ankle a week ago. Posterior lateral pain. Ran on it Tuesday pain after her run.   Medications patient has been prescribed:   Taking:         Reviewed prior external information including notes and imaging from previsou exam, outside providers and external EMR if available.   As well as notes that were available from care everywhere and other healthcare systems.  Past medical history, social, surgical and family history all reviewed in electronic medical record.  No pertanent information unless stated regarding to the chief complaint.   Past Medical History:  Diagnosis Date  . Allergy   . Anxiety   . Syncope   . Vaso vagal episode     Allergies  Allergen Reactions  . Dust Mite Extract   . Amoxicillin Rash and Hives     Review of Systems:  No headache, visual changes, nausea, vomiting, diarrhea, constipation, dizziness, abdominal pain, skin rash, fevers, chills, night sweats, weight loss, swollen lymph nodes, body aches, joint swelling, chest pain,  shortness of breath, mood changes. POSITIVE muscle aches  Objective  Blood pressure 120/82, pulse 68, height 5\' 7"  (1.702 m), weight 147 lb (66.7 kg), SpO2 100 %.   General: No apparent distress alert and oriented x3 mood and affect normal, dressed appropriately.  HEENT: Pupils equal, extraocular movements intact  Respiratory: Patient's speak in full sentences and does not appear short of breath  Cardiovascular: No lower extremity edema, non tender, no erythema  Neuro: Cranial nerves II through XII are intact, neurovascularly intact in all extremities with 2+ DTRs and 2+ pulses.  Gait normal with good balance and coordination.  MSK: Left ankle very mild tenderness over the ATFL.  Negative anterior drawer.  Good strength of the ankle noted. Right hand exam shows at the middle finger at the MP joint patient does have a small cyst noted.  Freely movable and tender to palpation.  Very small in nature.  It is tender. Back -back exam does have some tenderness to palpation mostly in the thoracolumbar juncture.  Patient does have pain over the right sacroiliac joint.  Full range of motion otherwise.  Mild tightness with on the right compared to left.  Osteopathic findings  C3 flexed rotated and side bent left T3 extended rotated and side bent right inhaled rib T5 extended rotated and side bent left L2 flexed rotated and side bent right Sacrum right on right   Limited musculoskeletal ultrasound was performed and interpreted by Pearlean Brownie  Limited ultrasound of patient's hand shows the  patient does have a's hypoechoic cervical in the subdermis layer.  This is superficial to the tendon and does not seem to have any communication.  No abnormal blood flow noted. Impression: Simple cyst likely small ganglion first inclusion body    Assessment and Plan:  Left ankle sprain Grade 1 sprain of the ATFL.  Home exercises given to work with Event organiser.  Discussed icing regimen and home  exercises.  Follow-up again in 4 to 8 weeks  SI (sacroiliac) joint dysfunction Chronic problem, mild instability.  Discussed posture and ergonomics, discussed which activities to do which wants to avoid.  Has muscle relaxer for breakthrough pain.  Responding well to manipulation.  Epidermoid cyst of finger of right hand Seems to be more of a inclusion cyst.  This is superficial to the tendon.  Seems to be in the subcutaneous or epidermis areas.  Discussed with patient about different things to do.  Patient states that it is annoying.  We will leave it unless it starts giving more discomfort then consider the possibility of aspiration.  On ultrasound no abnormal blood flow.  Follow-up again in 2 months    Nonallopathic problems  Decision today to treat with OMT was based on Physical Exam  After verbal consent patient was treated with HVLA, ME, FPR techniques in cervical, rib, thoracic, lumbar, and sacral  areas  Patient tolerated the procedure well with improvement in symptoms  Patient given exercises, stretches and lifestyle modifications  See medications in patient instructions if given  Patient will follow up in 4-8 weeks      The above documentation has been reviewed and is accurate and complete Judi Saa, DO       Note: This dictation was prepared with Dragon dictation along with smaller phrase technology. Any transcriptional errors that result from this process are unintentional.

## 2020-03-24 NOTE — Assessment & Plan Note (Signed)
Grade 1 sprain of the ATFL.  Home exercises given to work with Event organiser.  Discussed icing regimen and home exercises.  Follow-up again in 4 to 8 weeks

## 2020-05-24 NOTE — Progress Notes (Signed)
[name] [address] [place] [address] Phone: [phone] Subjective:   Bruce Donath, am serving as a scribe for Dr. [name]. This visit occurred during the SARS-CoV-2 public health emergency.  Safety protocols were in place, including screening questions prior to the visit, additional usage of staff PPE, and extensive cleaning of exam room while observing appropriate contact time as indicated for disinfecting solutions.   I'm seeing this patient by the request  of:  Myrlene Broker, MD  CC: Back and neck pain follow-up  [id number]  Pamela Barnes is a 35 y.o. female coming in with complaint of back and neck pain. OMT 03/24/2020. Patient states that she has been doing well since last visit.  Patient has been able to run a couple different half marathons and did very well.  This was even in the mild internal running with very mild tightness of the hip.  No significant ankle pain.  Medications patient has been prescribed: None  Taking:         Reviewed prior external information including notes and imaging from previsou exam, outside providers and external EMR if available.   As well as notes that were available from care everywhere and other healthcare systems.  Past medical history, social, surgical and family history all reviewed in electronic medical record.  No pertanent information unless stated regarding to the chief complaint.   Past Medical History:  Diagnosis Date  . Allergy   . Anxiety   . Syncope   . Vaso vagal episode     Allergies  Allergen Reactions  . Dust Mite Extract   . Amoxicillin Rash and Hives     Review of Systems:  No headache, visual changes, nausea, vomiting, diarrhea, constipation, dizziness, abdominal pain, skin rash, fevers, chills, night sweats, weight loss, swollen lymph nodes, body aches, joint swelling, chest pain, shortness of breath, mood changes. POSITIVE muscle aches very  mild  Objective  Blood pressure 110/82, pulse 60, height 5\' 7"  (1.702 m), weight 160 lb (72.6 kg), SpO2 99 %.   General: No apparent distress alert and oriented x3 mood and affect normal, dressed appropriately.  HEENT: Pupils equal, extraocular movements intact  Respiratory: Patient's speak in full sentences and does not appear short of breath  Cardiovascular: No lower extremity edema, non tender, no erythema  Gait normal with good balance and coordination.  MSK:  Non tender with full range of motion and good stability and symmetric strength and tone of shoulders, elbows, wrist, hip, knee and ankles bilaterally.  Back -back exam does have some mild loss of lordosis.  Osteopathic findings  C2 flexed rotated and side bent right T3 extended rotated and side bent right inhaled rib T8 extended rotated and side bent left L2 flexed rotated and side bent right Sacrum right on right       Assessment and Plan:  SI (sacroiliac) joint dysfunction Patient has been doing fairly remarkable.  Discussed with patient in great length.  Discussed home exercises and icing regimen.  Discussed avoiding certain activities.  Patient can increase activity as tolerated.  Follow-up with me again in 2 to 3 months    Nonallopathic problems  Decision today to treat with OMT was based on Physical Exam  After verbal consent patient was treated with HVLA, ME, FPR techniques in cervical, rib, thoracic, lumbar, and sacral  areas  Patient tolerated the procedure well with improvement in symptoms  Patient given exercises, stretches and lifestyle modifications  See  medications in patient instructions if given  Patient will follow up in 4-8 weeks      The above documentation has been reviewed and is accurate and complete Lyndal Pulley, DO       Note: This dictation was prepared with Dragon dictation along with smaller phrase technology. Any transcriptional errors that result from this process are  unintentional.

## 2020-05-25 ENCOUNTER — Ambulatory Visit (INDEPENDENT_AMBULATORY_CARE_PROVIDER_SITE_OTHER): Payer: No Typology Code available for payment source | Admitting: Family Medicine

## 2020-05-25 ENCOUNTER — Other Ambulatory Visit: Payer: Self-pay

## 2020-05-25 ENCOUNTER — Encounter: Payer: Self-pay | Admitting: Family Medicine

## 2020-05-25 DIAGNOSIS — M533 Sacrococcygeal disorders, not elsewhere classified: Secondary | ICD-10-CM | POA: Diagnosis not present

## 2020-05-25 DIAGNOSIS — M999 Biomechanical lesion, unspecified: Secondary | ICD-10-CM | POA: Diagnosis not present

## 2020-05-25 NOTE — Assessment & Plan Note (Signed)
Patient has been doing fairly remarkable.  Discussed with patient in great length.  Discussed home exercises and icing regimen.  Discussed avoiding certain activities.  Patient can increase activity as tolerated.  Follow-up with me again in 2 to 3 months

## 2020-05-25 NOTE — Patient Instructions (Signed)
Try to stand at your work station Yoga wheel See me in 2-3 months

## 2020-07-10 ENCOUNTER — Other Ambulatory Visit: Payer: Self-pay | Admitting: Internal Medicine

## 2020-07-13 ENCOUNTER — Encounter: Payer: Self-pay | Admitting: Internal Medicine

## 2020-07-20 ENCOUNTER — Encounter: Payer: Self-pay | Admitting: Family Medicine

## 2020-08-24 NOTE — Progress Notes (Signed)
Pamela Barnes [address] [place] [address] Phone: [phone] Subjective:   Pamela Barnes, am serving as a scribe for Dr. [name].  This visit occurred during the SARS-CoV-2 public health emergency.  Safety protocols were in place, including screening questions prior to the visit, additional usage of staff PPE, and extensive cleaning of exam room while observing appropriate contact time as indicated for disinfecting solutions.    I'm seeing this patient by the request  of:  Myrlene Broker, MD  CC: back and neck pain   [id number]  Pamela Barnes is a 35 y.o. female coming in with complaint of back and neck pain. OMT 3/16/32022. Patient states that she has been having lateral R knee pain. Has been doing a lot of stretching which is not helping. Noticed pain after running 1/2 marathon on a trail. Also experiencing pain over medial aspect of L knee. Patient has not been doing long runs. Was running 20-30 miles a week but is now at 7-10 miles a week.   Notes that she is having neck tightenss since lat visit as well.  Medications patient has been prescribed: None          Reviewed prior external information including notes and imaging from previsou exam, outside providers and external EMR if available.   As well as notes that were available from care everywhere and other healthcare systems.  Past medical history, social, surgical and family history all reviewed in electronic medical record.  No pertanent information unless stated regarding to the chief complaint.   Past Medical History:  Diagnosis Date   Allergy    Anxiety    Syncope    Vaso vagal episode     Allergies  Allergen Reactions   Dust Mite Extract    Amoxicillin Rash and Hives     Review of Systems:  No headache, visual changes, nausea, vomiting, diarrhea, constipation, dizziness, abdominal pain, skin rash, fevers, chills, night sweats, weight loss, swollen  lymph nodes, body aches, joint swelling, chest pain, shortness of breath, mood changes. POSITIVE muscle aches  Objective  Blood pressure 110/86, pulse 85, height 5\' 7"  (1.702 m), weight 161 lb (73 kg), SpO2 97 %.   General: No apparent distress alert and oriented x3 mood and affect normal, dressed appropriately.  HEENT: Pupils equal, extraocular movements intact  Respiratory: Patient's speak in full sentences and does not appear short of breath  Cardiovascular: No lower extremity edema, non tender, no erythema  Gait normal with good balance and coordination.  MSK:   -right knee exam shows some mild lateral tracking of the patella.  Tightness noted of the IT band.  Weakness noted of the hip abductor on the right side. Low back exam does show the patient still has tightness noted in the sacroiliac joint.  Seems to be gone the right greater than left.  Positive Faber on the right.  Negative straight leg test. Osteopathic findings  C2 flexed rotated and side bent right T3 extended rotated and side bent right inhaled rib T8 extended rotated and side bent left L2 flexed rotated and side bent right Sacrum right on right      Assessment and Plan:  SI (sacroiliac) joint dysfunction Chronic problem with mild exacerbation.  Discussed with patient about icing regimen and home exercises.  Patient will continue with the gabapentin if it is starting to have worsening pain again.  Patient will otherwise follow-up with me again in 6 weeks.  It band  syndrome, right Seems to be more in tenderness to palpation over the iliotibial band distally as well as some weakness of the hip abductor.  Home exercises given.  Patient does have some muscle relaxers as needed.  Follow-up with me again in 6 weeks.  Worsening pain consider formal physical therapy.   Nonallopathic problems  Decision today to treat with OMT was based on Physical Exam  After verbal consent patient was treated with HVLA, ME, FPR  techniques in cervical, rib, thoracic, lumbar, and sacral  areas  Patient tolerated the procedure well with improvement in symptoms  Patient given exercises, stretches and lifestyle modifications  See medications in patient instructions if given  Patient will follow up in 6 weeks     The above documentation has been reviewed and is accurate and complete Judi Saa, DO        Note: This dictation was prepared with Dragon dictation along with smaller phrase technology. Any transcriptional errors that result from this process are unintentional.

## 2020-08-25 ENCOUNTER — Ambulatory Visit (INDEPENDENT_AMBULATORY_CARE_PROVIDER_SITE_OTHER): Payer: No Typology Code available for payment source | Admitting: Family Medicine

## 2020-08-25 ENCOUNTER — Other Ambulatory Visit: Payer: Self-pay

## 2020-08-25 ENCOUNTER — Encounter: Payer: Self-pay | Admitting: Family Medicine

## 2020-08-25 ENCOUNTER — Ambulatory Visit: Payer: Self-pay

## 2020-08-25 VITALS — BP 110/86 | HR 85 | Ht 67.0 in | Wt 161.0 lb

## 2020-08-25 DIAGNOSIS — M533 Sacrococcygeal disorders, not elsewhere classified: Secondary | ICD-10-CM

## 2020-08-25 DIAGNOSIS — M9908 Segmental and somatic dysfunction of rib cage: Secondary | ICD-10-CM | POA: Diagnosis not present

## 2020-08-25 DIAGNOSIS — M9904 Segmental and somatic dysfunction of sacral region: Secondary | ICD-10-CM | POA: Diagnosis not present

## 2020-08-25 DIAGNOSIS — M25561 Pain in right knee: Secondary | ICD-10-CM | POA: Diagnosis not present

## 2020-08-25 DIAGNOSIS — M9901 Segmental and somatic dysfunction of cervical region: Secondary | ICD-10-CM | POA: Diagnosis not present

## 2020-08-25 DIAGNOSIS — M7631 Iliotibial band syndrome, right leg: Secondary | ICD-10-CM

## 2020-08-25 DIAGNOSIS — M9902 Segmental and somatic dysfunction of thoracic region: Secondary | ICD-10-CM | POA: Diagnosis not present

## 2020-08-25 NOTE — Patient Instructions (Signed)
Good to see you  Hip abductor strengths exercises given Take a breath See me again in 5-6 weeks

## 2020-08-25 NOTE — Assessment & Plan Note (Signed)
Seems to be more in tenderness to palpation over the iliotibial band distally as well as some weakness of the hip abductor.  Home exercises given.  Patient does have some muscle relaxers as needed.  Follow-up with me again in 6 weeks.  Worsening pain consider formal physical therapy.

## 2020-08-25 NOTE — Assessment & Plan Note (Signed)
Chronic problem with mild exacerbation.  Discussed with patient about icing regimen and home exercises.  Patient will continue with the gabapentin if it is starting to have worsening pain again.  Patient will otherwise follow-up with me again in 6 weeks.

## 2020-09-03 IMAGING — MG DIGITAL DIAGNOSTIC BILAT W/ TOMO W/ CAD
6 of 10 series · 6 of 30 positions shown · non-contrast
Comparison: None.

CLINICAL DATA: Patient presents with a new palpable mass in the
left breast.

EXAM:
DIGITAL DIAGNOSTIC BILATERAL MAMMOGRAM WITH CAD AND TOMO
ULTRASOUND LEFT BREAST

[L TAN synth-2D]
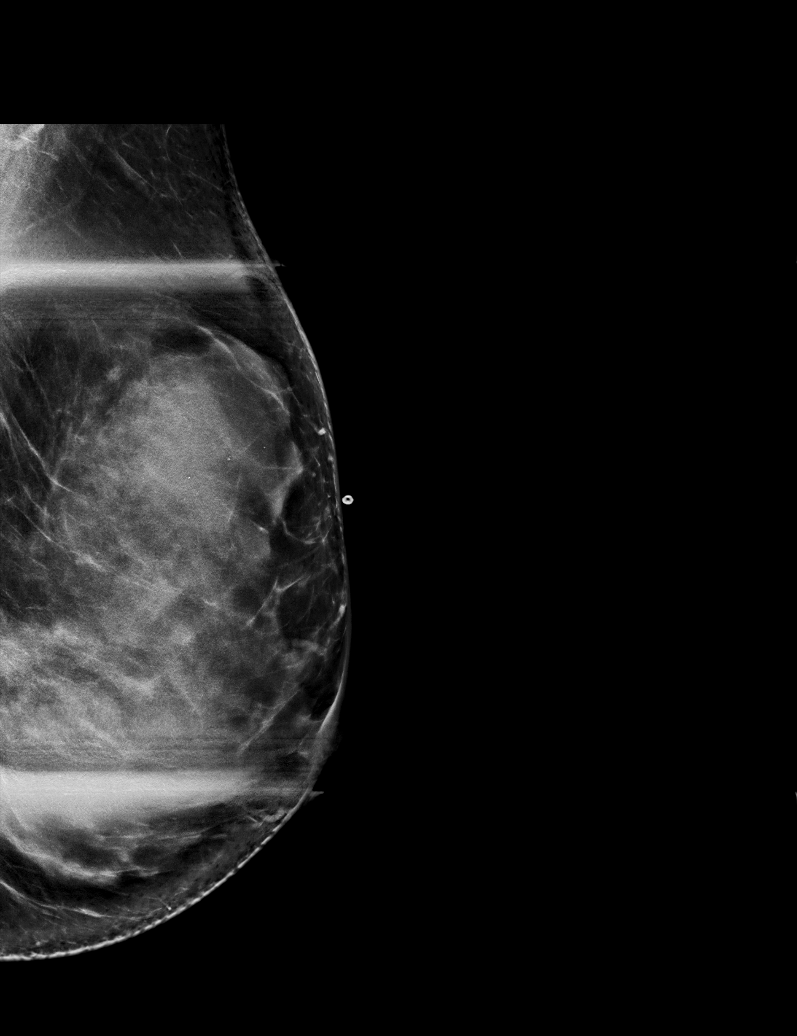

[R MLO synth-2D]
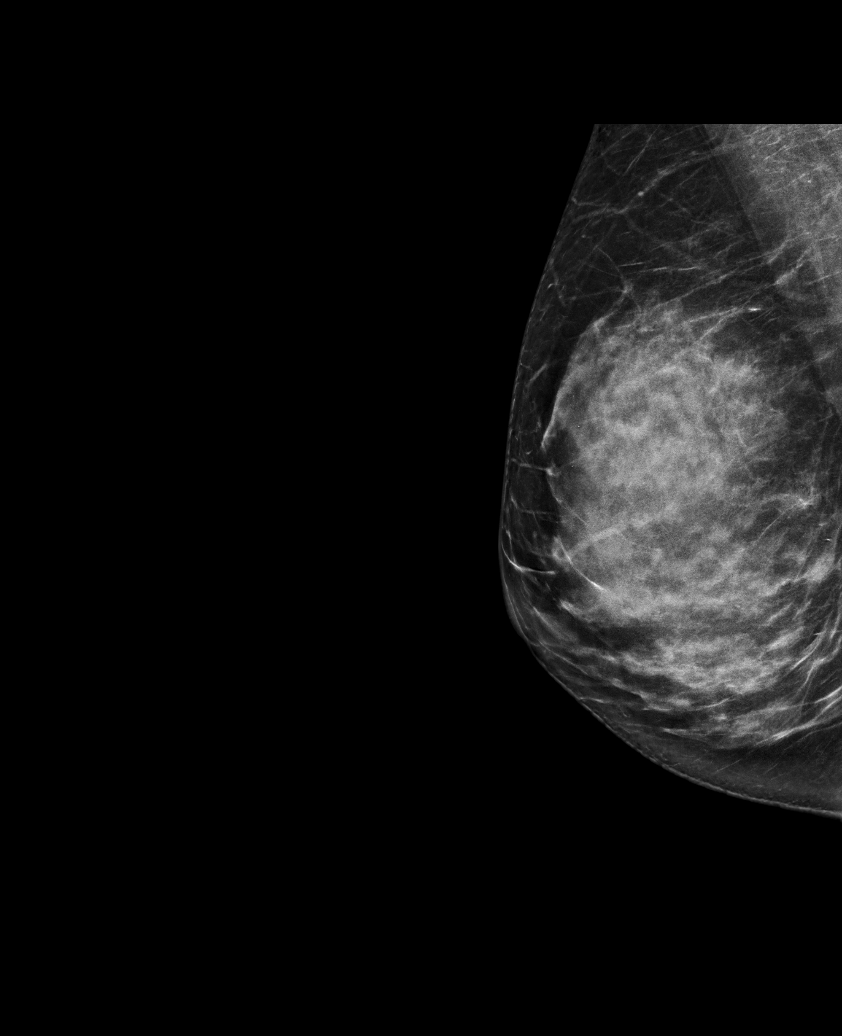

[L MLO synth-2D]
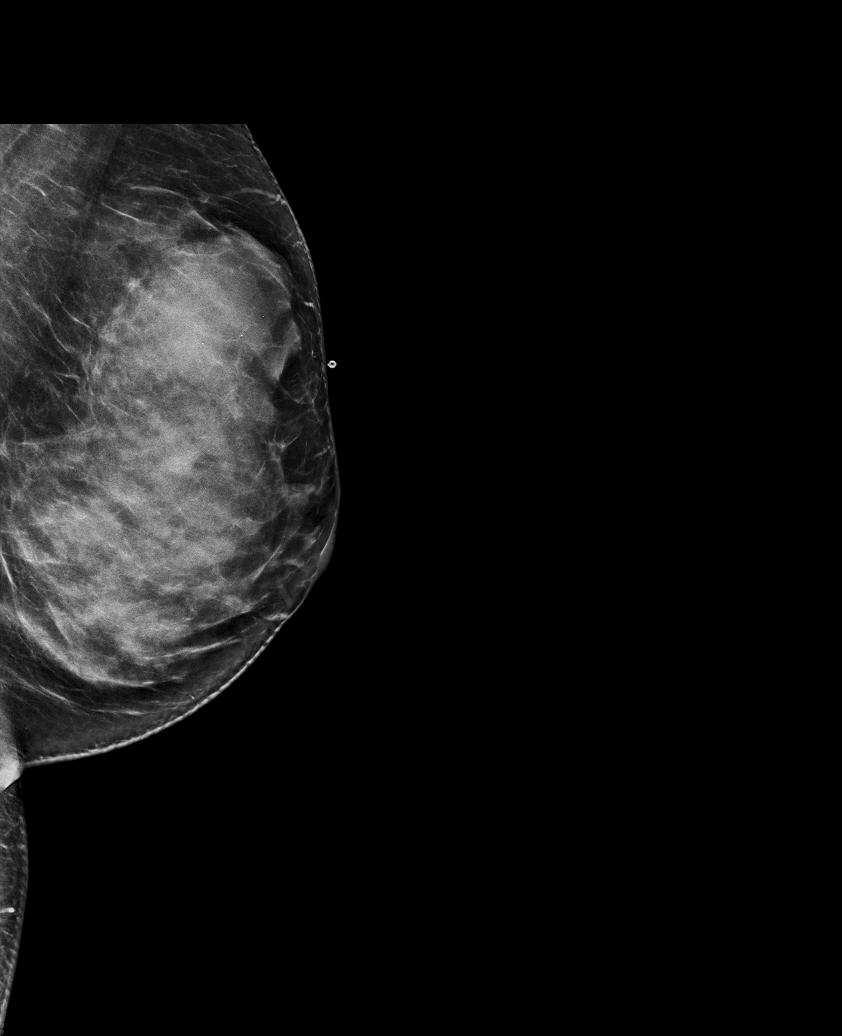

[L CC synth-2D]
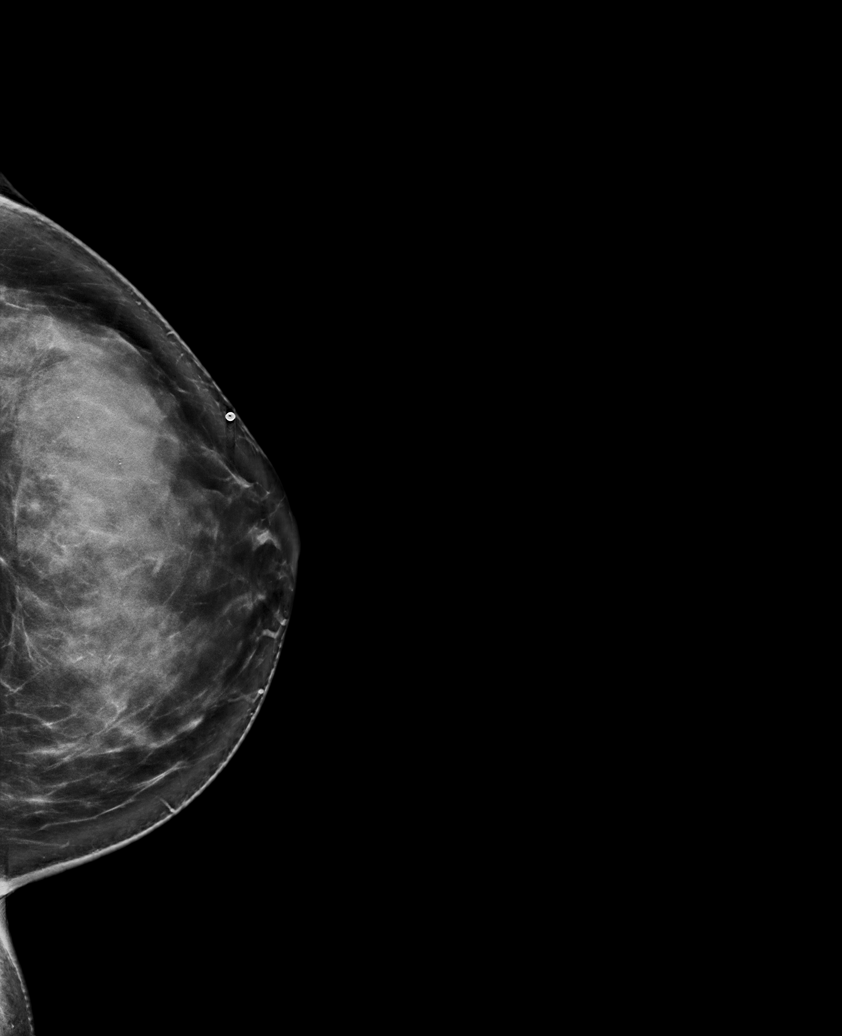

[R CC synth-2D]
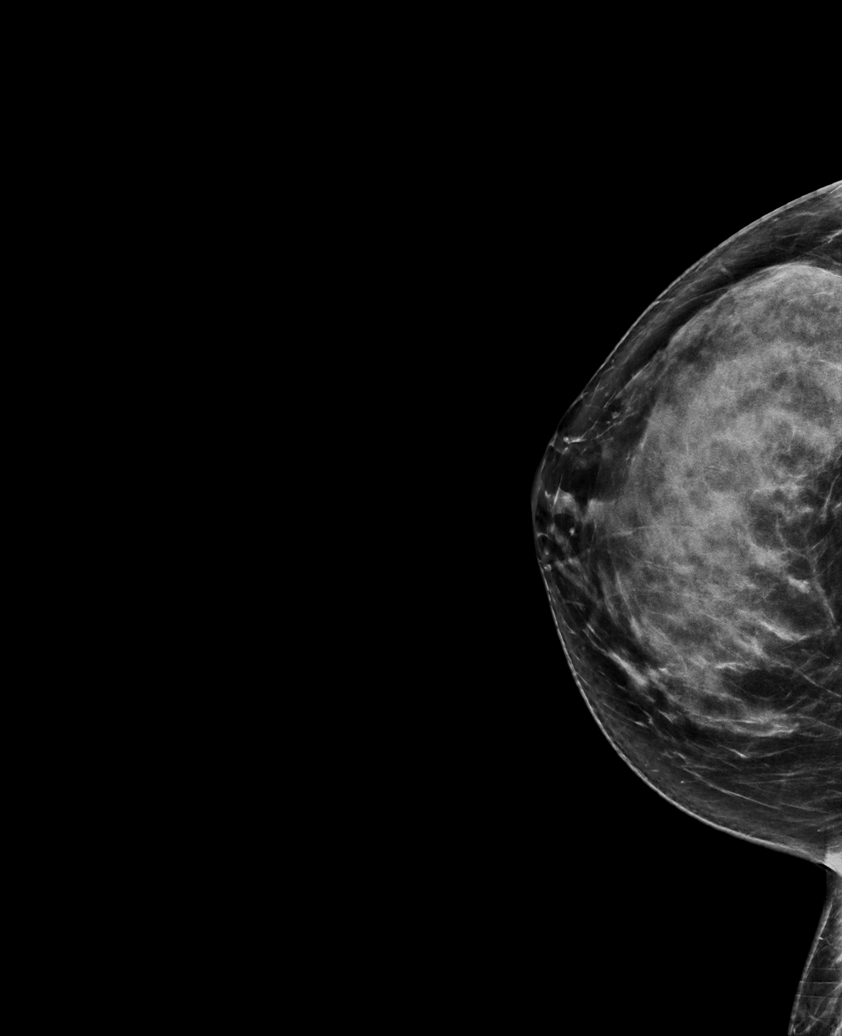

[L CC tomo · tomo slice 45/90.0]
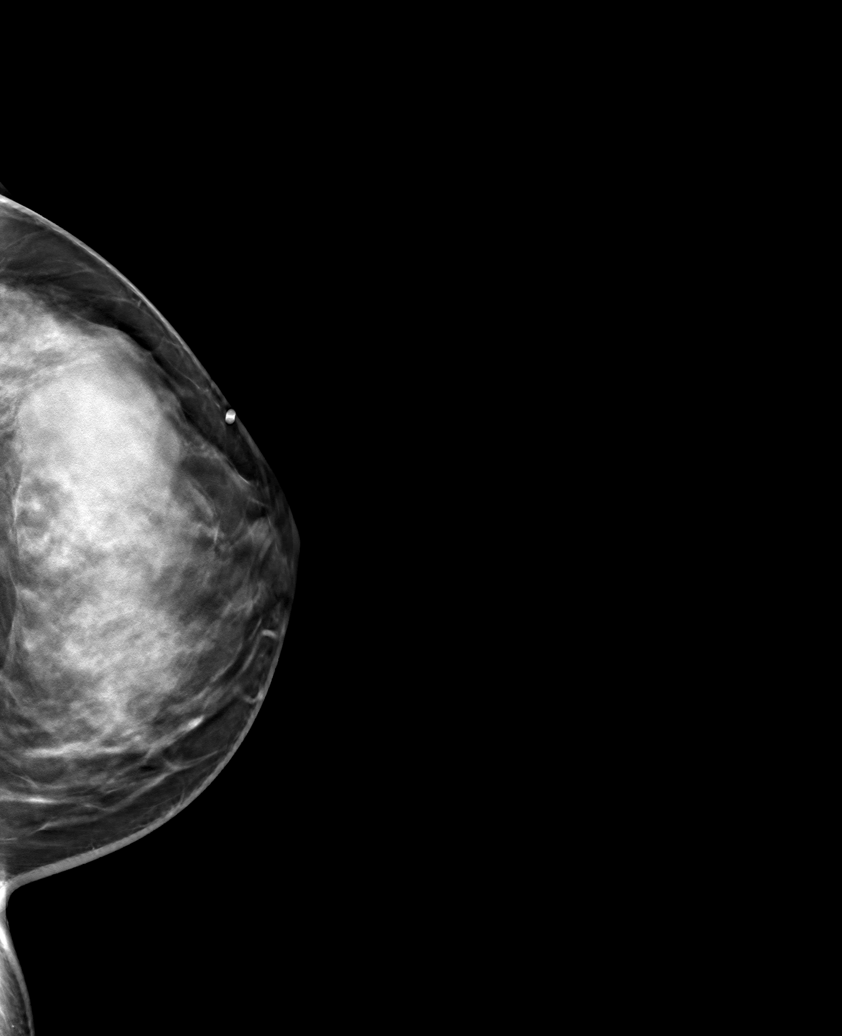

[6 of 30 positions shown; findings below may reference images not displayed]

ACR Breast Density Category d: The breast tissue is extremely dense,
which lowers the sensitivity of mammography.
FINDINGS: Mammogram:

Right breast: No suspicious mass, distortion, or microcalcifications
are identified to suggest presence of malignancy.

Left breast: At the palpable site of concern in the upper outer left
breast there is a mass measuring approximately 3.7 cm with margins
somewhat obscured by the adjacent dense tissue. No other findings
are identified in the left breast.

Mammographic images were processed with CAD.

On physical exam I palpate a smooth mobile mass at the palpable site
of concern.

Targeted ultrasound is performed in the left breast at 12:30 o'clock
4 cm from the nipple demonstrating an oval circumscribed anechoic
mass measuring 3.5 x 2.4 x 3.7 cm, which corresponds to the palpable
site of concern and the mammographic finding. Additionally noted in
the 12 o'clock 4 cm from the nipple are smaller similar benign
simple cysts.
IMPRESSION: At the palpable site of concern in the superior left breast there is
a 3.7 cm simple cyst. No mammographic or sonographic evidence of
malignancy.

RECOMMENDATION:
1. The cyst in the left breast is amenable to aspiration which the
patient desires for therapeutic purposes. This will be scheduled at
her convenience.

2.  Annual screening mammography beginning at age 40.

I have discussed the findings and recommendations with the patient.
If applicable, a reminder letter will be sent to the patient
regarding the next appointment.

BI-RADS CATEGORY  2: Benign.

## 2020-09-28 NOTE — Progress Notes (Signed)
[name] [address] [place] [address] Phone: [phone] Subjective:   Bruce Donath, am serving as a scribe for Dr. [name].  This visit occurred during the SARS-CoV-2 public health emergency.  Safety protocols were in place, including screening questions prior to the visit, additional usage of staff PPE, and extensive cleaning of exam room while observing appropriate contact time as indicated for disinfecting solutions.   I'm seeing this patient by the request  of:  Myrlene Broker, MD  CC: Knee pain, back pain follow-up  [id number]  08/25/2020 Seems to be more in tenderness to palpation over the iliotibial band distally as well as some weakness of the hip abductor.  Home exercises given.  Patient does have some muscle relaxers as needed.  Follow-up with me again in 6 weeks.  Worsening pain consider formal physical therapy.  Chronic problem with mild exacerbation.  Discussed with patient about icing regimen and home exercises.  Patient will continue with the gabapentin if it is starting to have worsening pain again.  Patient will otherwise follow-up with me again in 6 weeks.  Update 10/03/2020 Dovey Fatzinger is a 35 y.o. female coming in with complaint of back and neck pain. OMT 08/25/2020. Patient states that she has not been running. Has not been running in the past 2 weeks. Patient did not notice pain with last run or with squatting to move boxes. Painful with sitting for prolonged periods. Will start running in August.  Patient has only been to [facility]         Reviewed prior external information including notes and imaging from previsou exam, outside providers and external EMR if available.   As well as notes that were available from care everywhere and other healthcare systems.  Past medical history, social, surgical and family history all reviewed in electronic medical record.  No pertanent information unless stated  regarding to the chief complaint.   Past Medical History:  Diagnosis Date   Allergy    Anxiety    Syncope    Vaso vagal episode     Allergies  Allergen Reactions   Dust Mite Extract    Amoxicillin Rash and Hives     Review of Systems:  No headache, visual changes, nausea, vomiting, diarrhea, constipation, dizziness, abdominal pain, skin rash, fevers, chills, night sweats, weight loss, swollen lymph nodes, body aches, joint swelling, chest pain, shortness of breath, mood changes. POSITIVE muscle aches  Objective  Blood pressure 108/72, pulse 88, height 5\' 7"  (1.702 m), weight 160 lb (72.6 kg), SpO2 98 %.   General: No apparent distress alert and oriented x3 mood and affect normal, dressed appropriately.  HEENT: Pupils equal, extraocular movements intact  Respiratory: Patient's speak in full sentences and does not appear short of breath  Cardiovascular: No lower extremity edema, non tender, no erythema  Right knee exam shows some very mild lateral tracking noted of the patella.  Patient has some mild tenderness still in tightness of the IT band.  No significant instability noted of the knee itself.  Osteopathic findings  C6 flexed rotated and side bent right  T3 extended rotated and side bent right inhaled rib T7 extended rotated and side bent left L5 flexed rotated and side bent left  Sacrum right on right       Assessment and Plan:  SI (sacroiliac) joint dysfunction Sacroiliac dysfunction.  Still responds well to osteopathic manipulation at this moment.  Chronic problem with mild exacerbation.  Discussed  posture and ergonomics.  Discussed which activities to do 127 2 avoid.  Patient will increase activity slowly.  Follow-up with me again in 6 to 8 weeks.  It band syndrome, right Some seems to be IT band but I do think more patellofemoral.  Brace given for patient today.  Patient will be moving.  Discussed continuing to stay active.  Follow-up again in 6 to 8 weeks if  patient is entirely but is moving in the evening of another provider.   Nonallopathic problems  Decision today to treat with OMT was based on Physical Exam  After verbal consent patient was treated with HVLA, ME, FPR techniques in cervical, rib, thoracic, lumbar, and sacral  areas  Patient tolerated the procedure well with improvement in symptoms  Patient given exercises, stretches and lifestyle modifications  See medications in patient instructions if given  Patient will follow up in 4-8 weeks      The above documentation has been reviewed and is accurate and complete Judi Saa, DO       Note: This dictation was prepared with Dragon dictation along with smaller phrase technology. Any transcriptional errors that result from this process are unintentional.

## 2020-10-03 ENCOUNTER — Encounter: Payer: Self-pay | Admitting: Family Medicine

## 2020-10-03 ENCOUNTER — Ambulatory Visit (INDEPENDENT_AMBULATORY_CARE_PROVIDER_SITE_OTHER): Payer: No Typology Code available for payment source | Admitting: Family Medicine

## 2020-10-03 ENCOUNTER — Other Ambulatory Visit: Payer: Self-pay

## 2020-10-03 VITALS — BP 108/72 | HR 88 | Ht 67.0 in | Wt 160.0 lb

## 2020-10-03 DIAGNOSIS — M9901 Segmental and somatic dysfunction of cervical region: Secondary | ICD-10-CM | POA: Diagnosis not present

## 2020-10-03 DIAGNOSIS — M9903 Segmental and somatic dysfunction of lumbar region: Secondary | ICD-10-CM

## 2020-10-03 DIAGNOSIS — M9904 Segmental and somatic dysfunction of sacral region: Secondary | ICD-10-CM

## 2020-10-03 DIAGNOSIS — M9908 Segmental and somatic dysfunction of rib cage: Secondary | ICD-10-CM | POA: Diagnosis not present

## 2020-10-03 DIAGNOSIS — M533 Sacrococcygeal disorders, not elsewhere classified: Secondary | ICD-10-CM | POA: Diagnosis not present

## 2020-10-03 DIAGNOSIS — M9902 Segmental and somatic dysfunction of thoracic region: Secondary | ICD-10-CM

## 2020-10-03 DIAGNOSIS — M7631 Iliotibial band syndrome, right leg: Secondary | ICD-10-CM

## 2020-10-03 NOTE — Patient Instructions (Signed)
Good to see you Keep doing the exercises Theragun or grastin too to the knee after exercises and ice For the back continue the stretches Matt Kanaan in [place] See me again when you need me

## 2020-10-03 NOTE — Assessment & Plan Note (Addendum)
Some seems to be IT band but I do think more patellofemoral.  Brace given for patient today.  Patient will be moving.  Discussed continuing to stay active.  Follow-up again in 6 to 8 weeks if patient is entirely but is moving in the evening of another provider.

## 2020-10-03 NOTE — Assessment & Plan Note (Signed)
Sacroiliac dysfunction.  Still responds well to osteopathic manipulation at this moment.  Chronic problem with mild exacerbation.  Discussed posture and ergonomics.  Discussed which activities to do 127 2 avoid.  Patient will increase activity slowly.  Follow-up with me again in 6 to 8 weeks.

## 2020-11-02 ENCOUNTER — Encounter: Payer: Self-pay | Admitting: Internal Medicine

## 2021-02-04 ENCOUNTER — Other Ambulatory Visit: Payer: Self-pay | Admitting: Family Medicine
